# Patient Record
Sex: Male | Born: 1995 | Race: White | Hispanic: No | Marital: Single | Smoking: Current every day smoker
Health system: Southern US, Community
[De-identification: ages and names within clinical notes are randomized; demographics above are authoritative.]

## PROBLEM LIST (undated history)

## (undated) DIAGNOSIS — F32A Depression, unspecified: Secondary | ICD-10-CM

## (undated) DIAGNOSIS — H539 Unspecified visual disturbance: Secondary | ICD-10-CM

## (undated) DIAGNOSIS — F329 Major depressive disorder, single episode, unspecified: Secondary | ICD-10-CM

## (undated) DIAGNOSIS — T7840XA Allergy, unspecified, initial encounter: Secondary | ICD-10-CM

## (undated) DIAGNOSIS — F419 Anxiety disorder, unspecified: Secondary | ICD-10-CM

## (undated) DIAGNOSIS — R51 Headache: Secondary | ICD-10-CM

## (undated) HISTORY — PX: NO PAST SURGERIES: SHX2092

---
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
## 2015-11-14 ENCOUNTER — Ambulatory Visit: Payer: Self-pay | Admitting: Family Medicine

## 2015-11-27 ENCOUNTER — Ambulatory Visit: Payer: Self-pay | Admitting: Family Medicine

## 2015-11-28 ENCOUNTER — Encounter: Payer: Self-pay | Admitting: Nurse Practitioner

## 2016-01-02 ENCOUNTER — Ambulatory Visit: Payer: Self-pay | Admitting: Family Medicine

## 2016-01-06 ENCOUNTER — Encounter: Payer: Self-pay | Admitting: Nurse Practitioner

## 2016-02-25 ENCOUNTER — Ambulatory Visit: Payer: Self-pay | Admitting: Family Medicine

## 2016-03-17 ENCOUNTER — Ambulatory Visit: Payer: Self-pay | Admitting: Family Medicine

## 2016-03-24 ENCOUNTER — Encounter: Payer: Self-pay | Admitting: Family Medicine

## 2016-04-11 ENCOUNTER — Encounter (HOSPITAL_COMMUNITY): Payer: Self-pay | Admitting: Emergency Medicine

## 2016-04-11 ENCOUNTER — Emergency Department (HOSPITAL_COMMUNITY)
Admission: EM | Admit: 2016-04-11 | Discharge: 2016-04-13 | Disposition: A | Discharge status: Other Acute Inpatient Hospital | Payer: BLUE CROSS/BLUE SHIELD | Attending: Emergency Medicine | Admitting: Emergency Medicine

## 2016-04-11 DIAGNOSIS — Z79899 Other long term (current) drug therapy: Secondary | ICD-10-CM | POA: Diagnosis not present

## 2016-04-11 DIAGNOSIS — F333 Major depressive disorder, recurrent, severe with psychotic symptoms: Secondary | ICD-10-CM | POA: Diagnosis not present

## 2016-04-11 DIAGNOSIS — F1410 Cocaine abuse, uncomplicated: Secondary | ICD-10-CM | POA: Diagnosis present

## 2016-04-11 DIAGNOSIS — F17210 Nicotine dependence, cigarettes, uncomplicated: Secondary | ICD-10-CM | POA: Insufficient documentation

## 2016-04-11 DIAGNOSIS — R45851 Suicidal ideations: Secondary | ICD-10-CM | POA: Diagnosis present

## 2016-04-11 LAB — COMPREHENSIVE METABOLIC PANEL
ALBUMIN: 4.5 g/dL (ref 3.5–5.0)
ALT: 20 U/L (ref 17–63)
AST: 25 U/L (ref 15–41)
Alkaline Phosphatase: 72 U/L (ref 38–126)
Anion gap: 8 (ref 5–15)
BILIRUBIN TOTAL: 0.8 mg/dL (ref 0.3–1.2)
BUN: 13 mg/dL (ref 6–20)
CO2: 20 mmol/L — ABNORMAL LOW (ref 22–32)
CREATININE: 1.05 mg/dL (ref 0.61–1.24)
Calcium: 9.2 mg/dL (ref 8.9–10.3)
Chloride: 107 mmol/L (ref 101–111)
GFR calc Af Amer: >60 mL/min (ref >60)
GLUCOSE: 98 mg/dL (ref 65–99)
POTASSIUM: 3.5 mmol/L (ref 3.5–5.1)
Sodium: 135 mmol/L (ref 135–145)
TOTAL PROTEIN: 6.8 g/dL (ref 6.5–8.1)

## 2016-04-11 LAB — CBC
HEMATOCRIT: 40.9 % (ref 39.0–52.0)
Hemoglobin: 14.5 g/dL (ref 13.0–17.0)
MCH: 30.7 pg (ref 26.0–34.0)
MCHC: 35.5 g/dL (ref 30.0–36.0)
MCV: 86.5 fL (ref 78.0–100.0)
PLATELETS: 238 10*3/uL (ref 150–400)
RBC: 4.73 MIL/uL (ref 4.22–5.81)
RDW: 12.7 % (ref 11.5–15.5)
WBC: 3.1 10*3/uL — AB (ref 4.0–10.5)

## 2016-04-11 LAB — ETHANOL

## 2016-04-11 LAB — RAPID URINE DRUG SCREEN, HOSP PERFORMED
Amphetamines: NOT DETECTED
BARBITURATES: NOT DETECTED
BENZODIAZEPINES: NOT DETECTED
Cocaine: POSITIVE — AB
Opiates: NOT DETECTED
Tetrahydrocannabinol: NOT DETECTED

## 2016-04-11 LAB — CBG MONITORING, ED: GLUCOSE-CAPILLARY: 91 mg/dL (ref 65–99)

## 2016-04-11 LAB — ACETAMINOPHEN LEVEL: Acetaminophen (Tylenol), Serum: <10 ug/mL — ABNORMAL LOW (ref 10–30)

## 2016-04-11 LAB — SALICYLATE LEVEL: Salicylate Lvl: <7 mg/dL (ref 2.8–30.0)

## 2016-04-11 MED ORDER — NICOTINE 21 MG/24HR TD PT24
21.0000 mg | MEDICATED_PATCH | Freq: Every day | TRANSDERMAL | Status: DC
  Administered 2016-04-12 – 2016-04-13 (×2): 21 mg via TRANSDERMAL
  Filled 2016-04-11 (×2): qty 1

## 2016-04-11 MED ORDER — ALUM & MAG HYDROXIDE-SIMETH 200-200-20 MG/5ML PO SUSP: 30.0000 mL | ORAL | Status: DC | PRN

## 2016-04-11 MED ORDER — LORAZEPAM 1 MG PO TABS: 1.0000 mg | ORAL_TABLET | Freq: Three times a day (TID) | ORAL | Status: DC | PRN

## 2016-04-11 MED ORDER — IBUPROFEN 200 MG PO TABS: 600.0000 mg | ORAL_TABLET | Freq: Three times a day (TID) | ORAL | Status: DC | PRN

## 2016-04-11 MED ORDER — ZOLPIDEM TARTRATE 5 MG PO TABS
5.0000 mg | ORAL_TABLET | Freq: Every evening | ORAL | Status: DC | PRN
  Administered 2016-04-12 – 2016-04-13 (×2): 5 mg via ORAL
  Filled 2016-04-11 (×2): qty 1

## 2016-04-11 NOTE — ED Notes (Signed)
Patient made aware of urine sample. Urinal given to patient and patient encouraged to void when able.
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
## 2016-04-11 NOTE — ED Notes (Signed)
Poison Control notified. Patient is at risk to cardiac dysrhythmia, seizures, drowsiness, and CNS depression. If patient, seizes, treat patient with benzos, phenobarb is a second choice. Recommends getting repeat EKG after 6 hours. Does not recommend charcoal. Recommend  8 hours observation after intake. Additional recommendations for prolonged QRS or QTC.

## 2016-04-11 NOTE — ED Notes (Signed)
Per Poison Control, patient is to have a repeat EKG at 1900.

## 2016-04-11 NOTE — ED Triage Notes (Signed)
Pt from home via EMS- Per EMS,.pt admits to taking approx 13- 40 mg Celexa approx 2 hrs PTA. Pt reports that he suffers from depression and is upset lately because his girlfriend is expecting. Pt was given 2mg  IV Narcan on scene. Pt reported generalized pain en route and hyperventilated mulitple times per EMS. Pt is A&O and in NAD. Pt admits to SI

## 2016-04-11 NOTE — ED Notes (Signed)
SBAR Report received from previous nurse. Pt received calm and visible on unit. Pt denies current SI/ HI, A/V H, depression, anxiety, or pain at this time, and appears otherwise stable and free of distress. pt endorses that he is tired. Pt reminded of camera surveillance, q 15 min rounds, and rules of the milieu. Will continue to assess.

## 2016-04-11 NOTE — ED Notes (Signed)
TTS at bedside. 

## 2016-04-11 NOTE — ED Provider Notes (Signed)
[facility]-EMERGENCY DEPT Provider Note   CSN: [id number] Arrival date & time: 04/11/16  1259     History   Chief Complaint Chief Complaint  Patient presents with  . Drug Overdose  . Suicidal    HPI Dustin Owens is a 21 y.o. male. He presents for evaluation after a Celexa overdose. Patient is evasive and states "I declined to answer that question" when I ask him about his intentions.  He took 13 Celexa. Paramedics state that his pregnant girlfriend" called out of concern for him. Apparently there was 13 on a counted for pills, I excluding the 5 that apparently the girlfriend took.  HPI  Past Medical History:  Diagnosis Date  . Allergy   . Anxiety   . Depression   . Headache(784.0)   . Vision abnormalities     Patient Active Problem List   Diagnosis Date Noted  . 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 Conduct disorder, childhood onset type 07/01/2011  . Polysubstance dependence (HCC) 07/01/2011    Past Surgical History:  Procedure Laterality Date  . NO PAST SURGERIES         Home Medications    Prior to Admission medications   Medication Sig Start Date End Date Taking? Authorizing Provider  QUEtiapine (SEROQUEL XR) 400 MG 24 hr tablet Take 1 tablet (400 mg total) by mouth daily. 07/07/11 08/06/11  Chauncey MannGlenn E Jennings, MD    Family History Family History  Problem Relation Age of Onset  . OCD Mother   . Drug abuse Mother   . Schizophrenia Father   . Drug abuse Father     Social History Social History  Substance Use Topics  . Smoking status: Current Every Day Smoker    Packs/day: 1.00    Years: 5.00    Types: Cigarettes  . Smokeless tobacco: Never Used  . Alcohol use 0.5 oz/week    1 Standard drinks or equivalent per week     Comment: 1 - 1/2 5ths of bourbon     Allergies   Patient has no known allergies.   Review of Systems Review of Systems  Constitutional: Negative for appetite change, chills, diaphoresis,  fatigue and fever.  HENT: Negative for mouth sores, sore throat and trouble swallowing.   Eyes: Negative for visual disturbance.  Respiratory: Negative for cough, chest tightness, shortness of breath and wheezing.   Cardiovascular: Negative for chest pain.  Gastrointestinal: Negative for abdominal distention, abdominal pain, diarrhea, nausea and vomiting.  Endocrine: Negative for polydipsia, polyphagia and polyuria.  Genitourinary: Negative for dysuria, frequency and hematuria.  Musculoskeletal: Negative for gait problem.  Skin: Negative for color change, pallor and rash.  Neurological: Negative for dizziness, syncope, light-headedness and headaches.  Hematological: Does not bruise/bleed easily.  Psychiatric/Behavioral: Positive for dysphoric mood. Negative for behavioral problems and confusion.     Physical Exam Updated Vital Signs BP 103/81   Pulse 89   Temp 97.6 F (36.4 C) (Oral)   Resp 18   Ht 6' (1.829 m)   Wt 136 lb (61.7 kg)   SpO2 98%   BMI 18.44 kg/m   Physical Exam  Constitutional: He is oriented to person, place, and time. He appears well-developed and well-nourished. No distress.  Awake. Slurred speech. Oriented 3.  HENT:  Head: Normocephalic.  Eyes: Conjunctivae are normal. Pupils are equal, round, and reactive to light. No scleral icterus.  Neck: Normal range of motion. Neck supple. No thyromegaly present.  Cardiovascular: Normal rate and regular  rhythm.  Exam reveals no gallop and no friction rub.   No murmur heard. Pulmonary/Chest: Effort normal and breath sounds normal. No respiratory distress. He has no wheezes. He has no rales.  Abdominal: Soft. Bowel sounds are normal. He exhibits no distension. There is no tenderness. There is no rebound.  Musculoskeletal: Normal range of motion.  Neurological: He is alert and oriented to person, place, and time.  Skin: Skin is warm and dry. No rash noted.  Psychiatric: He has a normal mood and affect. His behavior is  normal.     ED Treatments / Results  Labs (all labs ordered are listed, but only abnormal results are displayed) Labs Reviewed  COMPREHENSIVE METABOLIC PANEL - Abnormal; Notable for the following:       Result Value   CO2 20 (*)    All other components within normal limits  ACETAMINOPHEN LEVEL - Abnormal; Notable for the following:    Acetaminophen (Tylenol), Serum <10 (*)    All other components within normal limits  CBC - Abnormal; Notable for the following:    WBC 3.1 (*)    All other components within normal limits  ETHANOL  SALICYLATE LEVEL  RAPID URINE DRUG SCREEN, HOSP PERFORMED  CBG MONITORING, ED    EKG  EKG Interpretation None       Radiology No results found.  Procedures Procedures (including critical care time)  Medications Ordered in ED Medications - No data to display   Initial Impression / Assessment and Plan / ED Course  I have reviewed the triage vital signs and the nursing notes.  Pertinent labs & imaging results that were available during my care of the patient were reviewed by me and considered in my medical decision making (see chart for details).  Clinical Course     Congestion. Patient will state intent. Screening labs normal. Await toxicology and TTS evaluation.  Final Clinical Impressions(s) / ED Diagnoses   Final diagnoses:  Depression, unspecified depression type    New Prescriptions New Prescriptions   No medications on file     Rolland Porter, MD 04/11/16 1410

## 2016-04-11 NOTE — ED Provider Notes (Signed)
Patient was seen and evaluated by TTS.  They recommended admission to psychiatric facility.  Patient this time is medically cleared for admission.   Dustin Nayobert Bernie Fobes, MD 04/11/16 [phone]

## 2016-04-11 NOTE — ED Notes (Signed)
Bed: WA17 Expected date:  Expected time:  Means of arrival:  Comments: Res A 

## 2016-04-12 DIAGNOSIS — F17210 Nicotine dependence, cigarettes, uncomplicated: Secondary | ICD-10-CM

## 2016-04-12 DIAGNOSIS — Z79899 Other long term (current) drug therapy: Secondary | ICD-10-CM

## 2016-04-12 DIAGNOSIS — Z813 Family history of other psychoactive substance abuse and dependence: Secondary | ICD-10-CM

## 2016-04-12 DIAGNOSIS — F1410 Cocaine abuse, uncomplicated: Secondary | ICD-10-CM | POA: Diagnosis present

## 2016-04-12 DIAGNOSIS — F333 Major depressive disorder, recurrent, severe with psychotic symptoms: Secondary | ICD-10-CM

## 2016-04-12 DIAGNOSIS — R45851 Suicidal ideations: Secondary | ICD-10-CM

## 2016-04-12 DIAGNOSIS — Z818 Family history of other mental and behavioral disorders: Secondary | ICD-10-CM

## 2016-04-12 MED ORDER — IBUPROFEN 200 MG PO TABS
600.0000 mg | ORAL_TABLET | Freq: Three times a day (TID) | ORAL | Status: DC | PRN
  Administered 2016-04-12 (×2): 600 mg via ORAL
  Filled 2016-04-12 (×2): qty 3

## 2016-04-12 MED ORDER — GABAPENTIN 300 MG PO CAPS
300.0000 mg | ORAL_CAPSULE | Freq: Three times a day (TID) | ORAL | Status: DC
  Administered 2016-04-12 – 2016-04-13 (×5): 300 mg via ORAL
  Filled 2016-04-12 (×5): qty 1

## 2016-04-12 MED ORDER — CARBAMAZEPINE 200 MG PO TABS
200.0000 mg | ORAL_TABLET | Freq: Two times a day (BID) | ORAL | Status: DC
  Administered 2016-04-12 – 2016-04-13 (×4): 200 mg via ORAL
  Filled 2016-04-12 (×4): qty 1

## 2016-04-12 MED ORDER — CITALOPRAM HYDROBROMIDE 10 MG PO TABS
10.0000 mg | ORAL_TABLET | Freq: Every day | ORAL | Status: DC
Start: 2016-04-12 — End: 2016-04-12

## 2016-04-12 NOTE — ED Notes (Signed)
SBAR Report received from previous nurse. Pt received calm and visible on unit. Pt denies current SI/ HI, A/V H, depression, anxiety, and rates pain 8/10 in his knee at this time, and appears otherwise stable and free of distress. Pt reminded of camera surveillance, q 15 min rounds, and rules of the milieu. Will continue to assess.

## 2016-04-12 NOTE — ED Notes (Signed)
This nurse with psychiatry team in pt room making morning rounds. Pt presents with sad affect. Reports "stress finally got the best of me." Pt denies HI. Denies AVH. Special checks q 15 mins in place for safety. Video monitoring in place. Will continue to monitor.

## 2016-04-12 NOTE — Progress Notes (Signed)
CSW faxed patient's IVC paperwork to Magistrate office. Magistrate confirmed paperwork received. CSW filed patient's IVC paperwork into IVC log book.  

## 2016-04-12 NOTE — Consult Note (Signed)
[facility] Consult   Reason for Consult:  Intentional overdose Referring Physician:  EDP Patient Identification: Dustin Owens MRN:  [id number] Principal Diagnosis: Severe recurrent major depression with psychotic features [facility]) Diagnosis:   Patient Active Problem List   Diagnosis Date Noted  . Cocaine abuse [F14.10] 04/12/2016    Priority: High  . Severe recurrent major depression with psychotic features ([place]) [F33.3] 07/01/2011    Priority: High  . 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 Polysubstance dependence ([place]) [F19.20] 07/01/2011    Total Time spent with patient: 45 minutes  Subjective:   Dustin Owens is a 21 y.o. male patient admitted with intentional overdose, "I said fuck it".  HPI:  21 yo male who presented to the ED after an overdose on Celexa in a suicide attempt.  Today, he feels a "bit disoriented."  He was stressed yesterday, "stress got the best of me."  His girlfriend is pregnant and they currently have an 48 old daughter.  He felt guilty for relapsing on crack and felt his girlfriend was going to leave him.  Past history of bipolar disorder but stopped taking Seroquel because he could not afford it.  No homicidal ideations or hallucinations  Past Psychiatric History: bipolar disorder, substance abuse   Risk to Self: Suicidal Ideation: Yes-Currently Present Suicidal Intent: Yes-Currently Present Is patient at risk for suicide?: Yes Suicidal Plan?: Yes-Currently Present Specify Current Suicidal Plan: overdose Access to Means: Yes Specify Access to Suicidal Means: girlfriend's medication What has been your use of drugs/alcohol within the last 12 months?: alcohol, cocaine How many times?: 1 Other Self Harm Risks: buring Triggers for Past Attempts: Family contact, Other (Comment) (money stress, relationship stress) Intentional Self Injurious Behavior: Burning (to cover needle marks) Risk to Others: Homicidal  Ideation: No Thoughts of Harm to Others: No Current Homicidal Intent: No Current Homicidal Plan: No Access to Homicidal Means: No Identified Victim: none History of harm to others?: No Assessment of Violence: None Noted Violent Behavior Description: none Does patient have access to weapons?: No Criminal Charges Pending?: Yes (possession in 2014) Describe Pending Criminal Charges: possession of drugs Does patient have a court date: No Prior Inpatient Therapy: Prior Inpatient Therapy: No Prior Outpatient Therapy: Prior Outpatient Therapy: Yes Prior Therapy Dates: as a child Does patient have an ACCT team?: No Does patient have Intensive In-House Services?  : No Does patient have [facility] services? : No Does patient have P4CC services?: No  Past Medical History:  Past Medical History:  Diagnosis Date  . Allergy   . Anxiety   . Depression   . Headache(784.0)   . Vision abnormalities     Past Surgical History:  Procedure Laterality Date  . NO PAST SURGERIES     Family History:  Family History  Problem Relation Age of Onset  . OCD Mother   . Drug abuse Mother   . Schizophrenia Father   . Drug abuse Father    Family Psychiatric  History: none Social History:  History  Alcohol Use  . 0.5 oz/week  . 1 Standard drinks or equivalent per week    Comment: 1 - 1/2 5ths of bourbon     History  Drug Use  . Frequency: 7.0 times per week  . Types: Amphetamines, Benzodiazepines, Cocaine, Codeine, Fentanyl, Hashish, Heroin, Hydrocodone, Hydromorphone, LSD, Marijuana, MDMA (Ecstacy), Methamphetamines, Morphine, Nitrous oxide, Opium, PCP, Solvent inhalants    Social History   Social History  . Marital status: Single  Spouse name: N/A  . Number of children: N/A  . Years of education: N/A   Occupational History  . student     11th grade at a [employer] facility   Social History Main Topics  . Smoking status: Current Every Day Smoker    Packs/day: 1.00    Years: 5.00     Types: Cigarettes  . Smokeless tobacco: Never Used  . Alcohol use 0.5 oz/week    1 Standard drinks or equivalent per week     Comment: 1 - 1/2 5ths of bourbon  . Drug use:     Frequency: 7.0 times per week    Types: Amphetamines, Benzodiazepines, Cocaine, Codeine, Fentanyl, Hashish, Heroin, Hydrocodone, Hydromorphone, LSD, Marijuana, MDMA (Ecstacy), Methamphetamines, Morphine, Nitrous oxide, Opium, PCP, Solvent inhalants  . Sexual activity: Yes    Partners: Female    Birth control/ protection: Condom   Other Topics Concern  . None   Social History Narrative  . None   Additional Social History:    Allergies:  No Known Allergies  Labs:  Results for orders placed or performed during the hospital encounter of 04/11/16 (from the past 48 hour(s))  Comprehensive metabolic panel     Status: Abnormal   Collection Time: 04/11/16  1:05 PM  Result Value Ref Range   Sodium 135 135 - 145 mmol/L   Potassium 3.5 3.5 - 5.1 mmol/L   Chloride 107 101 - 111 mmol/L   CO2 20 (L) 22 - 32 mmol/L   Glucose, Bld 98 65 - 99 mg/dL   BUN 13 6 - 20 mg/dL   Creatinine, Ser 1.05 0.61 - 1.24 mg/dL   Calcium 9.2 8.9 - 10.3 mg/dL   Total Protein 6.8 6.5 - 8.1 g/dL   Albumin 4.5 3.5 - 5.0 g/dL   AST 25 15 - 41 U/L   ALT 20 17 - 63 U/L   Alkaline Phosphatase 72 38 - 126 U/L   Total Bilirubin 0.8 0.3 - 1.2 mg/dL   GFR calc non Af Amer >60 >60 mL/min   GFR calc Af Amer >60 >60 mL/min    Comment: (NOTE) The eGFR has been calculated using the CKD EPI equation. This calculation has not been validated in all clinical situations. eGFR's persistently <60 mL/min signify possible Chronic Kidney Disease.    Anion gap 8 5 - 15  cbc     Status: Abnormal   Collection Time: 04/11/16  1:05 PM  Result Value Ref Range   WBC 3.1 (L) 4.0 - 10.5 K/uL   RBC 4.73 4.22 - 5.81 MIL/uL   Hemoglobin 14.5 13.0 - 17.0 g/dL   HCT 40.9 39.0 - 52.0 %   MCV 86.5 78.0 - 100.0 fL   MCH 30.7 26.0 - 34.0 pg   MCHC 35.5 30.0 - 36.0  g/dL   RDW 12.7 11.5 - 15.5 %   Platelets 238 150 - 400 K/uL  CBG monitoring, ED     Status: None   Collection Time: 04/11/16  1:06 PM  Result Value Ref Range   Glucose-Capillary 91 65 - 99 mg/dL  Ethanol     Status: None   Collection Time: 04/11/16  1:20 PM  Result Value Ref Range   Alcohol, Ethyl (B) <5 <5 mg/dL    Comment:        LOWEST DETECTABLE LIMIT FOR SERUM ALCOHOL IS 5 mg/dL FOR MEDICAL PURPOSES ONLY   Salicylate level     Status: None   Collection Time: 04/11/16  1:20 PM  Result Value Ref Range   Salicylate Lvl <0.6 2.8 - 30.0 mg/dL  Acetaminophen level     Status: Abnormal   Collection Time: 04/11/16  1:20 PM  Result Value Ref Range   Acetaminophen (Tylenol), Serum <10 (L) 10 - 30 ug/mL    Comment:        THERAPEUTIC CONCENTRATIONS VARY SIGNIFICANTLY. A RANGE OF 10-30 ug/mL MAY BE AN EFFECTIVE CONCENTRATION FOR MANY PATIENTS. HOWEVER, SOME ARE BEST TREATED AT CONCENTRATIONS OUTSIDE THIS RANGE. ACETAMINOPHEN CONCENTRATIONS >150 ug/mL AT 4 HOURS AFTER INGESTION AND >50 ug/mL AT 12 HOURS AFTER INGESTION ARE OFTEN ASSOCIATED WITH TOXIC REACTIONS.   Rapid urine drug screen (hospital performed)     Status: Abnormal   Collection Time: 04/11/16  3:04 PM  Result Value Ref Range   Opiates NONE DETECTED NONE DETECTED   Cocaine POSITIVE (A) NONE DETECTED   Benzodiazepines NONE DETECTED NONE DETECTED   Amphetamines NONE DETECTED NONE DETECTED   Tetrahydrocannabinol NONE DETECTED NONE DETECTED   Barbiturates NONE DETECTED NONE DETECTED    Comment:        DRUG SCREEN FOR MEDICAL PURPOSES ONLY.  IF CONFIRMATION IS NEEDED FOR ANY PURPOSE, NOTIFY LAB WITHIN 5 DAYS.        LOWEST DETECTABLE LIMITS FOR URINE DRUG SCREEN Drug Class       Cutoff (ng/mL) Amphetamine      1000 Barbiturate      200 Benzodiazepine   269 Tricyclics       485 Opiates          300 Cocaine          300 THC              50     Current Facility-Administered Medications  Medication Dose  Route Frequency Provider Last Rate Last Dose  . alum & mag hydroxide-simeth (MAALOX/MYLANTA) 200-200-20 MG/5ML suspension 30 mL  30 mL Oral PRN Leonard Schwartz, MD      . carbamazepine (TEGRETOL) tablet 200 mg  200 mg Oral BID Patrecia Pour, NP      . citalopram (CELEXA) tablet 10 mg  10 mg Oral Daily Patrecia Pour, NP      . gabapentin (NEURONTIN) capsule 300 mg  300 mg Oral TID Patrecia Pour, NP      . ibuprofen (ADVIL,MOTRIN) tablet 600 mg  600 mg Oral Q8H PRN Leonard Schwartz, MD      . nicotine (NICODERM CQ - dosed in mg/24 hours) patch 21 mg  21 mg Transdermal Daily Leonard Schwartz, MD   21 mg at 04/12/16 0941  . zolpidem (AMBIEN) tablet 5 mg  5 mg Oral QHS PRN Leonard Schwartz, MD       Current Outpatient Prescriptions  Medication Sig Dispense Refill  . QUEtiapine (SEROQUEL XR) 400 MG 24 hr tablet Take 1 tablet (400 mg total) by mouth daily. 30 tablet 0    Musculoskeletal: Strength & Muscle Tone: within normal limits Gait & Station: normal Patient leans: N/A  Psychiatric Specialty Exam: Physical Exam  Constitutional: He is oriented to person, place, and time. He appears well-developed and well-nourished.  HENT:  Head: Normocephalic.  Neck: Normal range of motion.  Respiratory: Effort normal and breath sounds normal.  Musculoskeletal: Normal range of motion.  Neurological: He is alert and oriented to person, place, and time.  Psychiatric: His speech is normal and behavior is normal. Cognition and memory are normal. He expresses impulsivity. He exhibits a depressed mood. He expresses suicidal ideation. He expresses  suicidal plans.    Review of Systems  Psychiatric/Behavioral: Positive for depression, substance abuse and suicidal ideas. The patient is nervous/anxious.   All other systems reviewed and are negative.   Blood pressure 129/69, pulse 61, temperature 97.8 F (36.6 C), temperature source Oral, resp. rate 16, height 6' (1.829 m), weight 61.7 kg (136 lb), SpO2 100 %.Body mass  index is 18.44 kg/m.  General Appearance: Disheveled  Eye Contact:  Fair  Speech:  Normal Rate  Volume:  Decreased  Mood:  Depressed  Affect:  Congruent  Thought Process:  Coherent and Descriptions of Associations: Intact  Orientation:  Full (Time, Place, and Person)  Thought Content:  Rumination  Suicidal Thoughts:  Yes.  with intent/plan  Homicidal Thoughts:  No  Memory:  Immediate;   Fair Recent;   Fair Remote;   Fair  Judgement:  Poor  Insight:  Fair  Psychomotor Activity:  Decreased  Concentration:  Concentration: Fair and Attention Span: Fair  Recall:  [employer] of Knowledge:  Fair  Language:  Good  Akathisia:  No  Handed:  Right  AIMS (if indicated):     Assets:  Housing Leisure Time Physical Health Resilience Social Support Vocational/Educational  ADL's:  Intact  Cognition:  WNL  Sleep:        Treatment Plan Summary: Daily contact with patient to assess and evaluate symptoms and progress in treatment, Medication management and Plan bipolar affective disorder, depressed, severe without psychosis:  -Crisis stabilization -Medication management:  Start Tegretol 200 mg BID for mood stabilization and gabapentin 300 mg TID for anxiety and withdrawal -Individual and substance abuse counseling  Disposition: Recommend psychiatric Inpatient admission when medically cleared.  Waylan Boga, NP 04/12/2016 12:56 PM   Patient seen for this face to face evaluation, case discussed with treatment team, physician extender and formulated treatment plan. Patient required IVC petition and certification as he has poor insight about his safety concers. Reviewed the information documented and agree with the treatment plan.  Keonna Raether 04/12/2016 1:11 PM

## 2016-04-13 ENCOUNTER — Inpatient Hospital Stay (HOSPITAL_COMMUNITY)
Admission: AD | Admit: 2016-04-13 | Discharge: 2016-04-17 | DRG: 885 | Disposition: A | Discharge status: Home / Self Care | Payer: BLUE CROSS/BLUE SHIELD | Attending: Psychiatry | Admitting: Psychiatry

## 2016-04-13 ENCOUNTER — Encounter (HOSPITAL_COMMUNITY): Payer: Self-pay | Admitting: Emergency Medicine

## 2016-04-13 DIAGNOSIS — Z818 Family history of other mental and behavioral disorders: Secondary | ICD-10-CM | POA: Diagnosis not present

## 2016-04-13 DIAGNOSIS — T1491XA Suicide attempt, initial encounter: Secondary | ICD-10-CM | POA: Diagnosis not present

## 2016-04-13 DIAGNOSIS — G4700 Insomnia, unspecified: Secondary | ICD-10-CM | POA: Diagnosis present

## 2016-04-13 DIAGNOSIS — Z79899 Other long term (current) drug therapy: Secondary | ICD-10-CM | POA: Diagnosis not present

## 2016-04-13 DIAGNOSIS — F411 Generalized anxiety disorder: Secondary | ICD-10-CM | POA: Diagnosis present

## 2016-04-13 DIAGNOSIS — R45851 Suicidal ideations: Secondary | ICD-10-CM | POA: Diagnosis present

## 2016-04-13 DIAGNOSIS — F17210 Nicotine dependence, cigarettes, uncomplicated: Secondary | ICD-10-CM | POA: Diagnosis present

## 2016-04-13 DIAGNOSIS — F333 Major depressive disorder, recurrent, severe with psychotic symptoms: Secondary | ICD-10-CM | POA: Diagnosis not present

## 2016-04-13 DIAGNOSIS — F1410 Cocaine abuse, uncomplicated: Secondary | ICD-10-CM | POA: Diagnosis present

## 2016-04-13 DIAGNOSIS — F314 Bipolar disorder, current episode depressed, severe, without psychotic features: Secondary | ICD-10-CM | POA: Diagnosis present

## 2016-04-13 DIAGNOSIS — Z915 Personal history of self-harm: Secondary | ICD-10-CM

## 2016-04-13 DIAGNOSIS — T43222A Poisoning by selective serotonin reuptake inhibitors, intentional self-harm, initial encounter: Secondary | ICD-10-CM | POA: Diagnosis not present

## 2016-04-13 DIAGNOSIS — Z813 Family history of other psychoactive substance abuse and dependence: Secondary | ICD-10-CM | POA: Diagnosis not present

## 2016-04-13 DIAGNOSIS — T50902A Poisoning by unspecified drugs, medicaments and biological substances, intentional self-harm, initial encounter: Secondary | ICD-10-CM | POA: Diagnosis present

## 2016-04-13 MED ORDER — HYDROXYZINE HCL 25 MG PO TABS
50.0000 mg | ORAL_TABLET | Freq: Four times a day (QID) | ORAL | Status: DC | PRN
  Administered 2016-04-13: 50 mg via ORAL
  Filled 2016-04-13: qty 2

## 2016-04-13 NOTE — ED Notes (Signed)
Patient denies SI, HI and HI . Plan of care discussed. Encouragement and support provided and safety maintain.

## 2016-04-13 NOTE — Progress Notes (Signed)
04/13/16 1400:  LRT went to pt room to offer activities, pt was sleep.  Caroll RancherMarjette Terance Pomplun, LRT/CTRS

## 2016-04-13 NOTE — ED Notes (Signed)
[employer] contacted for transportation to [facility]. 

## 2016-04-13 NOTE — ED Notes (Addendum)
Introduced self to patient. Pt oriented to unit expectations.  Assessed pt for:  A) Anxiety &/or agitation:  Pt has been calm and cooperative this morning, but reports having panic attacks when he is in his room.  Took medications without complaints. Affect flat, mood depressed.   S) Safety: Safety maintained with q-15-minute checks and hourly rounds by staff.  A) ADLs: Pt able to perform ADLs independently.   P) Pick-Up (room cleanliness): Pt's room clean and free of clutter.

## 2016-04-13 NOTE — BH Assessment (Signed)
Faxed clinical information to the following facilities for placement:  [facility]   [address]Neftali Thurow Ellis Patsy BaltimoreWarrick Jr, [facility], [facility], [facility] Triage Specialist [phone]

## 2016-04-13 NOTE — ED Notes (Signed)
[facility]. Belongings given to driver after patient signed for them. Pt left in no acute distress.

## 2016-04-13 NOTE — BH Assessment (Addendum)
[facility] Assessment Progress Note    Psychiatry requested for this patient to be re-evaluated on this day. Writer met with patient face to face. Patient sts that he is not suicidal and would like to discharge home. Patient sts, "Staying in this lock unit is making me feel worse....please let me go". Patient acknowledges that he presented due to an overdose, consuming 13 or more pills. Patient feels remorseful for his decision to overdose. He reports ongoing issues with depression including hopelessness and lack of motivation. Patient reports drinking alcohol and using cocaine "off and on" for years. Writer informed patient of his plan of care. Patient made aware that he is awaiting INPT placement at [facility] or another appropriate facility.

## 2016-04-14 ENCOUNTER — Encounter (HOSPITAL_COMMUNITY): Payer: Self-pay

## 2016-04-14 DIAGNOSIS — F314 Bipolar disorder, current episode depressed, severe, without psychotic features: Secondary | ICD-10-CM | POA: Diagnosis present

## 2016-04-14 DIAGNOSIS — T50902A Poisoning by unspecified drugs, medicaments and biological substances, intentional self-harm, initial encounter: Secondary | ICD-10-CM | POA: Diagnosis present

## 2016-04-14 DIAGNOSIS — Z888 Allergy status to other drugs, medicaments and biological substances status: Secondary | ICD-10-CM

## 2016-04-14 LAB — CBC WITH DIFFERENTIAL/PLATELET
Basophils Absolute: 0 10*3/uL (ref 0.0–0.1)
Basophils Relative: 0 %
Eosinophils Absolute: 0.2 10*3/uL (ref 0.0–0.7)
Eosinophils Relative: 3 %
HEMATOCRIT: 40.7 % (ref 39.0–52.0)
Hemoglobin: 13.9 g/dL (ref 13.0–17.0)
LYMPHS PCT: 36 %
Lymphs Abs: 1.6 10*3/uL (ref 0.7–4.0)
MCH: 30.2 pg (ref 26.0–34.0)
MCHC: 34.2 g/dL (ref 30.0–36.0)
MCV: 88.3 fL (ref 78.0–100.0)
MONO ABS: 0.5 10*3/uL (ref 0.1–1.0)
Monocytes Relative: 10 %
NEUTROS ABS: 2.3 10*3/uL (ref 1.7–7.7)
Neutrophils Relative %: 51 %
Platelets: 224 10*3/uL (ref 150–400)
RBC: 4.61 MIL/uL (ref 4.22–5.81)
RDW: 12.7 % (ref 11.5–15.5)
WBC: 4.6 10*3/uL (ref 4.0–10.5)

## 2016-04-14 MED ORDER — METHOCARBAMOL 500 MG PO TABS
500.0000 mg | ORAL_TABLET | Freq: Three times a day (TID) | ORAL | Status: DC | PRN
  Administered 2016-04-16 – 2016-04-17 (×3): 500 mg via ORAL
  Filled 2016-04-14 (×3): qty 1

## 2016-04-14 MED ORDER — ZOLPIDEM TARTRATE 5 MG PO TABS: 5.0000 mg | ORAL_TABLET | Freq: Every evening | ORAL | Status: DC | PRN

## 2016-04-14 MED ORDER — CARBAMAZEPINE 200 MG PO TABS
200.0000 mg | ORAL_TABLET | Freq: Two times a day (BID) | ORAL | Status: DC
  Administered 2016-04-14 – 2016-04-17 (×7): 200 mg via ORAL
  Filled 2016-04-14 (×3): qty 1
  Filled 2016-04-14: qty 14
  Filled 2016-04-14 (×5): qty 1
  Filled 2016-04-14: qty 14
  Filled 2016-04-14 (×2): qty 1

## 2016-04-14 MED ORDER — TRAZODONE HCL 50 MG PO TABS
50.0000 mg | ORAL_TABLET | Freq: Every evening | ORAL | Status: DC | PRN
  Filled 2016-04-14 (×7): qty 1

## 2016-04-14 MED ORDER — ALUM & MAG HYDROXIDE-SIMETH 200-200-20 MG/5ML PO SUSP: 30.0000 mL | ORAL | Status: DC | PRN

## 2016-04-14 MED ORDER — NICOTINE 21 MG/24HR TD PT24
21.0000 mg | MEDICATED_PATCH | Freq: Every day | TRANSDERMAL | Status: DC
  Administered 2016-04-14 – 2016-04-15 (×2): 21 mg via TRANSDERMAL
  Filled 2016-04-14 (×4): qty 1

## 2016-04-14 MED ORDER — CLONIDINE HCL 0.1 MG PO TABS: 0.1000 mg | ORAL_TABLET | Freq: Every day | ORAL | Status: DC

## 2016-04-14 MED ORDER — MAGNESIUM HYDROXIDE 400 MG/5ML PO SUSP: 30.0000 mL | Freq: Every day | ORAL | Status: DC | PRN

## 2016-04-14 MED ORDER — INFLUENZA VAC SPLIT QUAD 0.5 ML IM SUSY
0.5000 mL | PREFILLED_SYRINGE | INTRAMUSCULAR | Status: AC
  Administered 2016-04-15: 0.5 mL via INTRAMUSCULAR
  Filled 2016-04-14: qty 0.5

## 2016-04-14 MED ORDER — HYDROXYZINE HCL 25 MG PO TABS: 25.0000 mg | ORAL_TABLET | Freq: Four times a day (QID) | ORAL | Status: DC | PRN

## 2016-04-14 MED ORDER — LOPERAMIDE HCL 2 MG PO CAPS: 2.0000 mg | ORAL_CAPSULE | ORAL | Status: DC | PRN

## 2016-04-14 MED ORDER — DICYCLOMINE HCL 20 MG PO TABS: 20.0000 mg | ORAL_TABLET | Freq: Four times a day (QID) | ORAL | Status: DC | PRN

## 2016-04-14 MED ORDER — HYDROXYZINE HCL 50 MG PO TABS
50.0000 mg | ORAL_TABLET | Freq: Four times a day (QID) | ORAL | Status: DC | PRN
  Administered 2016-04-14 – 2016-04-17 (×7): 50 mg via ORAL
  Filled 2016-04-14: qty 1
  Filled 2016-04-14: qty 10
  Filled 2016-04-14 (×6): qty 1

## 2016-04-14 MED ORDER — CLONIDINE HCL 0.1 MG PO TABS: 0.1000 mg | ORAL_TABLET | ORAL | Status: DC

## 2016-04-14 MED ORDER — IBUPROFEN 600 MG PO TABS
600.0000 mg | ORAL_TABLET | Freq: Three times a day (TID) | ORAL | Status: DC | PRN
  Administered 2016-04-14 – 2016-04-16 (×2): 600 mg via ORAL
  Filled 2016-04-14 (×2): qty 1

## 2016-04-14 MED ORDER — ACETAMINOPHEN 325 MG PO TABS: 650.0000 mg | ORAL_TABLET | Freq: Four times a day (QID) | ORAL | Status: DC | PRN

## 2016-04-14 MED ORDER — GABAPENTIN 300 MG PO CAPS
300.0000 mg | ORAL_CAPSULE | Freq: Three times a day (TID) | ORAL | Status: DC
  Administered 2016-04-14: 300 mg via ORAL
  Filled 2016-04-14 (×5): qty 1

## 2016-04-14 MED ORDER — CLONIDINE HCL 0.1 MG PO TABS: 0.1000 mg | ORAL_TABLET | Freq: Four times a day (QID) | ORAL | Status: DC

## 2016-04-14 MED ORDER — ONDANSETRON 4 MG PO TBDP
4.0000 mg | ORAL_TABLET | Freq: Four times a day (QID) | ORAL | Status: DC | PRN
  Administered 2016-04-16: 4 mg via ORAL
  Filled 2016-04-14: qty 1

## 2016-04-14 NOTE — Tx Team (Signed)
Interdisciplinary Treatment and Diagnostic Plan Update  04/14/2016 Time of Session: 9:30AM Dustin Owens MRN: [id number]  Principal Diagnosis: Suicide attempt by drug ingestion [facility])  Secondary Diagnoses: Principal Problem:   Suicide attempt by drug ingestion [facility]) Active Problems:   Bipolar affective disorder, depressed, severe (HCC)   Current Medications:  Current Facility-Administered Medications  Medication Dose Route Frequency Provider Last Rate Last Dose  . acetaminophen (TYLENOL) tablet 650 mg  650 mg Oral Q6H PRN Charm Rings, NP      . alum & mag hydroxide-simeth (MAALOX/MYLANTA) 200-200-20 MG/5ML suspension 30 mL  30 mL Oral Q4H PRN Charm Rings, NP      . carbamazepine (TEGRETOL) tablet 200 mg  200 mg Oral BID Charm Rings, NP   200 mg at 04/14/16 0824  . dicyclomine (BENTYL) tablet 20 mg  20 mg Oral Q6H PRN Adonis Brook, NP      . hydrOXYzine (ATARAX/VISTARIL) tablet 50 mg  50 mg Oral Q6H PRN Adonis Brook, NP   50 mg at 04/14/16 1550  . ibuprofen (ADVIL,MOTRIN) tablet 600 mg  600 mg Oral Q8H PRN Charm Rings, NP   600 mg at 04/14/16 0018  . [START ON 04/15/2016] Influenza vac split quadrivalent PF (FLUARIX) injection 0.5 mL  0.5 mL Intramuscular Tomorrow-1000 Fernando A Cobos, MD      . loperamide (IMODIUM) capsule 2-4 mg  2-4 mg Oral PRN Adonis Brook, NP      . magnesium hydroxide (MILK OF MAGNESIA) suspension 30 mL  30 mL Oral Daily PRN Charm Rings, NP      . methocarbamol (ROBAXIN) tablet 500 mg  500 mg Oral Q8H PRN Adonis Brook, NP      . nicotine (NICODERM CQ - dosed in mg/24 hours) patch 21 mg  21 mg Transdermal Daily Charm Rings, NP   21 mg at 04/14/16 0825  . ondansetron (ZOFRAN-ODT) disintegrating tablet 4 mg  4 mg Oral Q6H PRN Adonis Brook, NP      . traZODone (DESYREL) tablet 50 mg  50 mg Oral QHS,MR X 1 Kerry Hough, PA-C       PTA Medications: Prescriptions Prior to Admission  Medication Sig Dispense Refill Last Dose  . QUEtiapine  (SEROQUEL XR) 400 MG 24 hr tablet Take 1 tablet (400 mg total) by mouth daily. 30 tablet 0     Patient Stressors: Paediatric nurse issue Marital or family conflict Occupational concerns Substance abuse  Patient Strengths: Dentist for treatment/growth  Treatment Modalities: Medication Management, Group therapy, Case management,  1 to 1 session with clinician, Psychoeducation, Recreational therapy.   Physician Treatment Plan for Primary Diagnosis: Suicide attempt by drug ingestion (HCC) Long Term Goal(s): Improvement in symptoms so as ready for discharge Improvement in symptoms so as ready for discharge   Short Term Goals: Ability to identify changes in lifestyle to reduce recurrence of condition will improve Ability to verbalize feelings will improve Ability to disclose and discuss suicidal ideas Ability to demonstrate self-control will improve Ability to identify and develop effective coping behaviors will improve Ability to maintain clinical measurements within normal limits will improve Compliance with prescribed medications will improve Ability to identify triggers associated with substance abuse/mental health issues will improve Ability to identify changes in lifestyle to reduce recurrence of condition will improve Ability to verbalize feelings will improve Ability to disclose and discuss suicidal ideas Ability to demonstrate self-control will improve Ability to identify and develop effective coping  behaviors will improve Ability to maintain clinical measurements within normal limits will improve Compliance with prescribed medications will improve Ability to identify triggers associated with substance abuse/mental health issues will improve  Medication Management: Evaluate patient's response, side effects, and tolerance of medication regimen.  Therapeutic Interventions: 1 to 1 sessions, Unit Group sessions and  Medication administration.  Evaluation of Outcomes: Progressing  Physician Treatment Plan for Secondary Diagnosis: Principal Problem:   Suicide attempt by drug ingestion (HCC) Active Problems:   Bipolar affective disorder, depressed, severe (HCC)  Long Term Goal(s): Improvement in symptoms so as ready for discharge Improvement in symptoms so as ready for discharge   Short Term Goals: Ability to identify changes in lifestyle to reduce recurrence of condition will improve Ability to verbalize feelings will improve Ability to disclose and discuss suicidal ideas Ability to demonstrate self-control will improve Ability to identify and develop effective coping behaviors will improve Ability to maintain clinical measurements within normal limits will improve Compliance with prescribed medications will improve Ability to identify triggers associated with substance abuse/mental health issues will improve Ability to identify changes in lifestyle to reduce recurrence of condition will improve Ability to verbalize feelings will improve Ability to disclose and discuss suicidal ideas Ability to demonstrate self-control will improve Ability to identify and develop effective coping behaviors will improve Ability to maintain clinical measurements within normal limits will improve Compliance with prescribed medications will improve Ability to identify triggers associated with substance abuse/mental health issues will improve     Medication Management: Evaluate patient's response, side effects, and tolerance of medication regimen.  Therapeutic Interventions: 1 to 1 sessions, Unit Group sessions and Medication administration.  Evaluation of Outcomes: Progressing   RN Treatment Plan for Primary Diagnosis: Suicide attempt by drug ingestion (HCC) Long Term Goal(s): Knowledge of disease and therapeutic regimen to maintain health will improve  Short Term Goals: Ability to remain free from injury will  improve, Ability to participate in decision making will improve and Ability to verbalize feelings will improve  Medication Management: RN will administer medications as ordered by provider, will assess and evaluate patient's response and provide education to patient for prescribed medication. RN will report any adverse and/or side effects to prescribing provider.  Therapeutic Interventions: 1 on 1 counseling sessions, Psychoeducation, Medication administration, Evaluate responses to treatment, Monitor vital signs and CBGs as ordered, Perform/monitor CIWA, COWS, AIMS and Fall Risk screenings as ordered, Perform wound care treatments as ordered.  Evaluation of Outcomes: Progressing   LCSW Treatment Plan for Primary Diagnosis: Suicide attempt by drug ingestion [facility](HCC) Long Term Goal(s): Safe transition to appropriate next level of care at discharge, Engage patient in therapeutic group addressing interpersonal concerns.  Short Term Goals: Engage patient in aftercare planning with referrals and resources, Facilitate patient progression through stages of change regarding substance use diagnoses and concerns and Identify triggers associated with mental health/substance abuse issues  Therapeutic Interventions: Assess for all discharge needs, 1 to 1 time with Social worker, Explore available resources and support systems, Assess for adequacy in community support network, Educate family and significant other(s) on suicide prevention, Complete Psychosocial Assessment, Interpersonal group therapy.  Evaluation of Outcomes: Progressing   Progress in Treatment: Attending groups: No. New to unit. Continuing to assess.  Participating in groups: No. Taking medication as prescribed: Yes. Toleration medication: Yes. Family/Significant other contact made: No, will contact:  family member if patient consents. Patient understands diagnosis: Yes. Discussing patient identified problems/goals with staff: Yes. Medical  problems stabilized or resolved: Yes. Denies  suicidal/homicidal ideation: No. Passive SI/Able to contract for safety on the unit.  Issues/concerns per patient self-inventory: No. Other: n/a  New problem(s) identified: No, Describe:  n/a  New Short Term/Long Term Goal(s):  Discharge Plan or Barriers:   Reason for Continuation of Hospitalization: Depression Medication stabilization Suicidal ideation Withdrawal symptoms  Estimated Length of Stay: 3-5 days   Attendees: Patient: 04/14/2016 4:35 PM  Physician: Dr. Jama Flavors MD 04/14/2016 4:35 PM  Nursing: Rayfield Citizen RN; [place]ette RN 04/14/2016 4:35 PM  RN Care Manager: Onnie Boer CM 04/14/2016 4:35 PM  Social Worker: Herbert Seta Smart, LCSW; Vernie Shanks LCSW 04/14/2016 4:35 PM  Recreational Therapist:  04/14/2016 4:35 PM  Other: Hillery Jacks; May Augustin NP 04/14/2016 4:35 PM  Other:  04/14/2016 4:35 PM  Other: 04/14/2016 4:35 PM    Scribe for Treatment Team: Ledell Peoples Smart, LCSW 04/14/2016 4:35 PM

## 2016-04-14 NOTE — Progress Notes (Signed)
D: Patient states he is not here "for substance abuse."  He couldn't understand why his Remus Lofflerambien was discontinued.  Patient states he cannot sleep with trazodone.  He rates his depression as a 3; hopelessness as a 4; anxiety as a 7.  He denies any thoughts of self harm.  Patient has flat, blunted affect.  His main complaint is anxiety.  Patient states, "I just did something stupid. Patient has minimal withdrawal symptoms. A: Continue to monitor medication management and MD orders.  Safety checks completed every 15 minutes per protocol.  Offer support and encouragement as needed. R: Patient is receptive to staff; his behavior is appropriate.

## 2016-04-14 NOTE — Progress Notes (Signed)
Psychoeducational Group Note  Date:  04/14/2016 Time:  2323  Group Topic/Focus:  Wrap-Up Group:   The focus of this group is to help patients review their daily goal of treatment and discuss progress on daily workbooks.   Participation Level: Did Not Attend  Participation Quality:  Not Applicable  Affect:  Not Applicable  Cognitive:  Not Applicable  Insight:  Not Applicable  Engagement in Group: Not Applicable  Additional Comments:  The patient did not attend group this evening since he was asleep in his bed.    Hazle CocaGOODMAN, Sameul Tagle S 04/14/2016, 11:23 PM

## 2016-04-14 NOTE — [facility] Group Notes (Signed)
[facility] LCSW Group Therapy  04/14/2016 4:14 PM  Type of Therapy:  Group Therapy  Participation Level:  Active  Participation Quality:  Attentive  Affect:  Appropriate  Cognitive:  Alert and Oriented  Insight:  Improving  Engagement in Therapy:  Improving  Modes of Intervention:  Discussion, Education, Exploration, Socialization and Support  Summary of Progress/Problems: [facility] Speaker came to talk about his personal journey with substance abuse and addiction. The pt processed ways by which to relate to the speaker. MHA speaker provided handouts and educational information pertaining to groups and services [facility] by [facility].   Alya Smaltz N Smart LCSW 04/14/2016, 4:14 PM

## 2016-04-14 NOTE — H&P (Signed)
Psychiatric Admission Assessment Adult  Patient Identification: ERMIN PARISIEN MRN:  [id number] Date of Evaluation:  04/14/2016 Chief Complaint:  MDD SEV REC WITH PSYCHOTIC FEATURES Principal Diagnosis: Bipolar affective disorder, depressed, severe ([place]) Diagnosis:   Patient Active Problem List   Diagnosis Date Noted  . Bipolar affective disorder, depressed, severe ([place]) [F31.4] 04/14/2016    Priority: High  . Cocaine abuse [F14.10] 04/12/2016  . 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 Polysubstance dependence [facility]) [F19.20] 07/01/2011   History of Present Illness: Kaitlin A Gehret is an 21 y.o. male was brought to [facility] by EMS after ingesting 13 - 40 mg Celexa per nurse note.  He states that he does not want be here.  He has poor insight into the events of his Celexa overdose.  He is irritable and wants to be on Ativan.   Patient reports that he took his wife's  medication because he is suicidal.  He states that when he goes home all he hears is that he is good for nothing.  Then he also states that he wants to go home and not be here because he is so anxious and that standing is painful for him.  Patient reports current stressors include money, relational conflict and issues at his job.  "I wish I could do my life over, now I have a baby and one on the way and a wife that nags me all the time."    Patient reports overdosing in an attempt at suicide when he was a teen.  Patient was inpatient at [facility] in 2013 on the adolescent unit.  Patient had been on Seroquel in 2014 and reports he was compliant for about 7 mos but stopped due to increased somnolence.  Per chart report, patient was  using alcohol weekly and last drank a 40 oz on 04/10/16, weekly cocaine use and used $40 worth 2 days ago.    He states that Tegretol which was started at [facility] seems to be doing ok.    Associated  Signs/Symptoms: Depression Symptoms:  depressed mood, hopelessness, anxiety, (Hypo) Manic Symptoms:  Irritable Mood, Anxiety Symptoms:  Excessive Worry, Psychotic Symptoms:  NA PTSD Symptoms: NA Total Time spent with patient: 45 minutes  Past Psychiatric History: see HPI  Is the patient at risk to self? Yes.    Has the patient been a risk to self in the past 6 months? Yes.    Has the patient been a risk to self within the distant past? Yes.    Is the patient a risk to others? No.  Has the patient been a risk to others in the past 6 months? No.  Has the patient been a risk to others within the distant past? No.   Prior Inpatient Therapy:   Prior Outpatient Therapy:    Alcohol Screening: 1. How often do you have a drink containing alcohol?: 4 or more times a week 2. How many drinks containing alcohol do you have on a typical day when you are drinking?: 3 or 4 3. How often do you have six or more drinks on one occasion?: Monthly Preliminary Score: 3 4. How often during the last year have you found that you were not able to stop drinking once you had started?: Never 5. How often during the last year have you failed to do what was normally expected from you becasue of drinking?: Never 6. How often during the  last year have you needed a first drink in the morning to get yourself going after a heavy drinking session?: Never 7. How often during the last year have you had a feeling of guilt of remorse after drinking?: Weekly 8. How often during the last year have you been unable to remember what happened the night before because you had been drinking?: Less than monthly 9. Have you or someone else been injured as a result of your drinking?: No 10. Has a relative or friend or a doctor or another health worker been concerned about your drinking or suggested you cut down?: Yes, during the last year Alcohol Use Disorder Identification Test Final Score (AUDIT): 15 Brief Intervention:  Yes Substance Abuse History in the last 12 months:  Yes.   Consequences of Substance Abuse: inpatient treatment Previous Psychotropic Medications: Yes  Psychological Evaluations: Yes  Past Medical History:  Past Medical History:  Diagnosis Date  . Allergy   . Anxiety   . Depression   . Headache(784.0)   . Vision abnormalities     Past Surgical History:  Procedure Laterality Date  . NO PAST SURGERIES     Family History:  Family History  Problem Relation Age of Onset  . OCD Mother   . Drug abuse Mother   . Schizophrenia Father   . Drug abuse Father    Family Psychiatric  History: see HPI, Pt reports a family history of suicide and substance abuse.  Tobacco Screening: Have you used any form of tobacco in the last 30 days? (Cigarettes, Smokeless Tobacco, Cigars, and/or Pipes): Yes Tobacco use, Select all that apply: 5 or more cigarettes per day Are you interested in Tobacco Cessation Medications?: Yes, will notify MD for an order Counseled patient on smoking cessation including recognizing danger situations, developing coping skills and basic information about quitting provided: Refused/Declined practical counseling Social History:  History  Alcohol Use  . 0.5 oz/week  . 1 Standard drinks or equivalent per week    Comment: 1 - 1/2 5ths of bourbon     History  Drug Use  . Frequency: 7.0 times per week  . Types: Amphetamines, Benzodiazepines, Cocaine, Codeine, Fentanyl, Hashish, Heroin, Hydrocodone, Hydromorphone, LSD, Marijuana, MDMA (Ecstacy), Methamphetamines, Morphine, Nitrous oxide, Opium, PCP, Solvent inhalants    Additional Social History:      Pain Medications: pt denies Prescriptions: pt denies Over the Counter: pt denies History of alcohol / drug use?: Yes Longest period of sobriety (when/how long): 2 weeks Negative Consequences of Use: Legal Withdrawal Symptoms: Agitation, Aggressive/Assaultive, Fever / Chills, Irritability, Sweats, Tingling Name of  Substance 1: alcohol 1 - Age of First Use: 10 1 - Amount (size/oz): 6 beers to half a gallon 1 - Frequency: weekly 1 - Duration: since age 68 1 - Last Use / Amount: 04/10/16, 40 oz Name of Substance 2: Cocaine 2 - Age of First Use: 14 2 - Amount (size/oz): 2 grams 2 - Frequency: weekly 2 - Duration: unknown 2 - Last Use / Amount: 2 days ago, $40    Allergies:   Allergies  Allergen Reactions  . Naproxen Nausea And Vomiting   Lab Results: No results found for this or any previous visit (from the past 48 hour(s)).  Blood Alcohol level:  Lab Results  Component Value Date   ETH <5 04/11/2016   ETH <11 19/14/7829    Metabolic Disorder Labs:  No results found for: HGBA1C, MPG No results found for: PROLACTIN No results found for: CHOL, TRIG, HDL, CHOLHDL,  VLDL, LDLCALC  Current Medications: Current Facility-Administered Medications  Medication Dose Route Frequency Provider Last Rate Last Dose  . acetaminophen (TYLENOL) tablet 650 mg  650 mg Oral Q6H PRN Patrecia Pour, NP      . alum & mag hydroxide-simeth (MAALOX/MYLANTA) 200-200-20 MG/5ML suspension 30 mL  30 mL Oral Q4H PRN Patrecia Pour, NP      . carbamazepine (TEGRETOL) tablet 200 mg  200 mg Oral BID Patrecia Pour, NP   200 mg at 04/14/16 0824  . gabapentin (NEURONTIN) capsule 300 mg  300 mg Oral TID Patrecia Pour, NP   300 mg at 04/14/16 0824  . ibuprofen (ADVIL,MOTRIN) tablet 600 mg  600 mg Oral Q8H PRN Patrecia Pour, NP   600 mg at 04/14/16 0018  . [START ON 04/15/2016] Influenza vac split quadrivalent PF (FLUARIX) injection 0.5 mL  0.5 mL Intramuscular Tomorrow-1000 Fernando A Cobos, MD      . magnesium hydroxide (MILK OF MAGNESIA) suspension 30 mL  30 mL Oral Daily PRN Patrecia Pour, NP      . nicotine (NICODERM CQ - dosed in mg/24 hours) patch 21 mg  21 mg Transdermal Daily Patrecia Pour, NP   21 mg at 04/14/16 0825  . traZODone (DESYREL) tablet 50 mg  50 mg Oral QHS,MR X 1 Spencer E Simon, PA-C       PTA  Medications: Prescriptions Prior to Admission  Medication Sig Dispense Refill Last Dose  . QUEtiapine (SEROQUEL XR) 400 MG 24 hr tablet Take 1 tablet (400 mg total) by mouth daily. 30 tablet 0     Musculoskeletal: Strength & Muscle Tone: within normal limits Gait & Station: normal Patient leans: N/A  Psychiatric Specialty Exam: Physical Exam  Nursing note and vitals reviewed.   ROS  Blood pressure 124/83, pulse 89, temperature 97.5 F (36.4 C), temperature source Oral, resp. rate 16, height 6' (1.829 m), weight 63 kg (139 lb).Body mass index is 18.85 kg/m.  General Appearance: Disheveled  Eye Contact:  Fair  Speech:  Normal Rate  Volume:  Decreased  Mood:  Depressed  Affect:  Congruent  Thought Process:  Coherent and Descriptions of Associations: Intact  Orientation:  Full (Time, Place, and Person)  Thought Content:  Rumination  Suicidal Thoughts:  Yes.  with intent/plan  Homicidal Thoughts:  No  Memory:  Immediate;   Fair Recent;   Fair Remote;   Fair  Judgement:  Poor  Insight:  Shallow  Psychomotor Activity:  Decreased  Concentration:  Concentration: Fair and Attention Span: Fair  Recall:  [employer] of Knowledge:  Fair  Language:  Good  Akathisia:  No  Handed:  Right  AIMS (if indicated):     Assets:  Housing Physical Health Resilience  ADL's:  Intact  Cognition:  WNL  Sleep:  Number of Hours: 4.75   Treatment Plan Summary: Admit for crisis management and mood stabilization. Medication management to re-stabilize current mood symptoms Group counseling sessions for coping skills Medical consults as needed Review and reinstate any pertinent home medications for other health problems  Observation Level/Precautions:  15 minute checks  Laboratory:  Per ED, UDS cocaine pos  Psychotherapy:  group  Medications:  increased Vistaril to 50 mg PRN  Consultations:  As needed  Discharge Concerns:  safety  Estimated LOS:  2-7 days  Other:     Physician Treatment  Plan for Primary Diagnosis: Bipolar affective disorder, depressed, severe ([place]) Long Term Goal(s): Improvement in symptoms so  as ready for discharge  Short Term Goals: Ability to identify changes in lifestyle to reduce recurrence of condition will improve, Ability to verbalize feelings will improve, Ability to disclose and discuss suicidal ideas, Ability to demonstrate self-control will improve, Ability to identify and develop effective coping behaviors will improve, Ability to maintain clinical measurements within normal limits will improve, Compliance with prescribed medications will improve and Ability to identify triggers associated with substance abuse/mental health issues will improve  Physician Treatment Plan for Secondary Diagnosis: Principal Problem:   Bipolar affective disorder, depressed, severe ([place])  Long Term Goal(s): Improvement in symptoms so as ready for discharge  Short Term Goals: Ability to identify changes in lifestyle to reduce recurrence of condition will improve, Ability to verbalize feelings will improve, Ability to disclose and discuss suicidal ideas, Ability to demonstrate self-control will improve, Ability to identify and develop effective coping behaviors will improve, Ability to maintain clinical measurements within normal limits will improve, Compliance with prescribed medications will improve and Ability to identify triggers associated with substance abuse/mental health issues will improve  I certify that inpatient services furnished can reasonably be expected to improve the patient's condition.    [facility], NP [facility] 1/16/20189:22 AM   I have discussed case with NP and have met with patient  Agree with NP note and assessment  21 year old male, lives with GF, has one daughter - 8 months- employed. Reports he has frequent , short lived mood swings, up to several a day. He overdosed on GF's prescribed Celexa- took 14 tablets. This occurred 3 days ago. States  his intent was " to die", but states he regretted suicide attempt shortly after it occurred, thinking of his daughter. He has a history of one prior hospitalization at age 50 for suicide attempt, depression . At the time was prescribed Seroquel- has not taken any psychiatric medications " for years ".  He drinks in binges , usually once ever two weeks.  He uses cocaine 3 x a week.  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 Medical history is remarkable for nephrolithiasis.  Dx- Suicide Attempt  Plan- Patient has been started on Tegretol.
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
## 2016-04-14 NOTE — Tx Team (Signed)
Initial Treatment Plan 04/14/2016 12:28 AM Dustin Owens [id number]    PATIENT STRESSORS: Financial difficulties Legal issue Marital or family conflict Occupational concerns Substance abuse   PATIENT STRENGTHS: Wellsite geologistCommunication skills General fund of knowledge Motivation for treatment/growth   PATIENT IDENTIFIED PROBLEMS: Depression  Anxiety  Suicidal ideation  Substance abuse  "Be able to no snap like that again"  "Anxiety better"           DISCHARGE CRITERIA:  Improved stabilization in mood, thinking, and/or behavior Verbal commitment to aftercare and medication compliance Withdrawal symptoms are absent or subacute and managed without 24-hour nursing intervention  PRELIMINARY DISCHARGE PLAN: Outpatient therapy medication management  PATIENT/FAMILY INVOLVEMENT: This treatment plan has been presented to and reviewed with the patient, Dustin Owens.  The patient and family have been given the opportunity to ask questions and make suggestions.  Levin BaconHeather V Wilburta Milbourn, RN 04/14/2016, 12:28 AM
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
## 2016-04-14 NOTE — Progress Notes (Addendum)
Report received from admitting RN.  Pt denies SI/HI, denies hallucinations, denies withdrawal symptoms, reports right knee pain of 5/10.  PRN medication administered for pain.  Pt verbally contracts for safety and reports he will inform staff of needs and concerns.  Will continue to monitor and assess.

## 2016-04-14 NOTE — Progress Notes (Signed)
Recreation Therapy Notes  Animal-Assisted Activity (AAA) Program Checklist/Progress Notes Patient Eligibility Criteria Checklist & Daily Group note for Rec TxIntervention  Date: 01.16.2018 Time: 2:45pm Location: [address] Dayroom    AAA/T Program Assumption of Risk Form signed by Patient/ or Parent Legal Guardian Yes  Patient is free of allergies or sever asthma Yes  Patient reports no fear of animals Yes  Patient reports no history of cruelty to animals Yes  Patient understands his/her participation is voluntary Yes  Behavioral Response: Did not attend.   Brexton Sofia L Daianna Vasques, LRT/CTRS        Marce Charlesworth L 04/14/2016 3:06 PM 

## 2016-04-14 NOTE — Progress Notes (Signed)
Dustin Owens is a 21 year old male being admitted involuntarily to [facility] from [facility].  He came to the ED via ambulance after ingesting 13-40 mg Celexa pills (his girlfriends).  He reported one prior suicide attempt in the past as a teen and [facility]  He reported multiple stressor including money problems, relationship problems and issues with his job.  His girlfriend his currently pregnant and they also have an 318 month old daughter.  He is reporting feeling anxious, hopeless, isolating and trouble sleeping.  He denies any medical issues and appears to be in no physical distress.  He currently denies suicidal ideation and will contract for safety on the unit.  He denies HI or A/V hallucinations.  Oriented him to the unit.  Admission paperwork completed and signed.  No belongings on admission but stated he had some in the police car.  Security was notified and they will be looking for his belongings.  Skin assessment completed and noted multiple tattoos and no other skin issues noted.  Q 15 minute checks initiated for safety.  We will monitor the progress towards his goals.

## 2016-04-15 DIAGNOSIS — T1491XA Suicide attempt, initial encounter: Secondary | ICD-10-CM

## 2016-04-15 DIAGNOSIS — Z79899 Other long term (current) drug therapy: Secondary | ICD-10-CM

## 2016-04-15 DIAGNOSIS — T43222A Poisoning by selective serotonin reuptake inhibitors, intentional self-harm, initial encounter: Secondary | ICD-10-CM

## 2016-04-15 DIAGNOSIS — Z818 Family history of other mental and behavioral disorders: Secondary | ICD-10-CM

## 2016-04-15 DIAGNOSIS — F17210 Nicotine dependence, cigarettes, uncomplicated: Secondary | ICD-10-CM

## 2016-04-15 DIAGNOSIS — F314 Bipolar disorder, current episode depressed, severe, without psychotic features: Principal | ICD-10-CM

## 2016-04-15 DIAGNOSIS — Z813 Family history of other psychoactive substance abuse and dependence: Secondary | ICD-10-CM

## 2016-04-15 LAB — TSH: TSH: 2.132 u[IU]/mL (ref 0.350–4.500)

## 2016-04-15 MED ORDER — ZOLPIDEM TARTRATE 5 MG PO TABS
5.0000 mg | ORAL_TABLET | Freq: Every evening | ORAL | Status: DC | PRN
  Administered 2016-04-15 – 2016-04-16 (×2): 5 mg via ORAL
  Filled 2016-04-15 (×2): qty 1

## 2016-04-15 MED ORDER — NICOTINE POLACRILEX 2 MG MT GUM
2.0000 mg | CHEWING_GUM | OROMUCOSAL | Status: DC | PRN
  Administered 2016-04-16 – 2016-04-17 (×2): 2 mg via ORAL
  Filled 2016-04-15 (×2): qty 1

## 2016-04-15 NOTE — [facility] Group Notes (Signed)
[facility] Note   04/15/2016 11:21 AM  Participation Quality:  Appropriate   Mood/Affect:  Anxious  Depression Rating:  4  Anxiety Rating:  8  Thoughts of Suicide:  No Will you contract for safety?   NA  Current AVH:  No  Plan for Discharge/Comments:  Pt wants to discharge "as soon as the weather clears." Pt reports high anxiety due to "home issues." He would like to return to work and [facility]states he has [facility]. CSW continuing to assess.  Transportation Means: unknown/girlfriend likely  Supports: girlfriend is "mildly supportive."  Cabin crewHeather N Smart LCSW

## 2016-04-15 NOTE — Progress Notes (Signed)
D: Pt presents with depressed affect and mood.  He was in bed in his room upon initial approach.  When asked how his day was, pt states "I'm alive."  Pt reports he "might be going home Thursday" and he feels safe to do so.  Denies SI/HI, denies hallucinations, reports right knee pain of 5/10.  Pt has stayed in his room for the majority of the night; did not attend evening group.    A: Introduced self to pt.  Support and encouragement offered.  PRN medication for pain offered, pt declined.    R: Scheduled Trazodone was not administered because pt was asleep.  He verbally contracts for safety.  Will continue to monitor and assess.

## 2016-04-15 NOTE — Progress Notes (Signed)
[facility] MD Progress Note  04/15/2016 4:40 PM Dustin Owens  MRN:  [id number]  Subjective: "I am not sleeping well. Trazodone is giving me a metallic taste in my mouth. I'm having a horrible nightmares. Why can't I have Ambien that has been working for me. With Ambien I usually will have a restful sleep. I have bad anxiety issues. What am I suppose to do? This is why I'm suppose to get good treatment here to be able to function out there after discharge".  Objective: Patient is seen, chart reviewed. He is alert, oriented x 4. He says he is not sleep ing well. Does not care for Trazodone as he says it is giving him some metallic taste in his mouth & causing him to have bad nightmares. He prefers Ambien, if nothing else while he in the hospital now. He says he needed a restful sleep. He declines Remeron & does not want to try Neurontin because he had done so in the past & felt they were not effective. Dustin Owens is attending group sessions & participating in some of the sessions. He denies major symptoms of depression, however, admits feeling frustrated. He denies any SIHI, AVH, delusional thoughts or paranoia. He is not disruptive on the unit. He currently is tolerating tegretol.   Principal Problem: Suicide attempt by drug ingestion [facility])  Diagnosis:   Patient Active Problem List   Diagnosis Date Noted  . Bipolar affective disorder, depressed, severe (HCC) [F31.4] 04/14/2016  . Suicide attempt by drug ingestion (HCC) [T50.902A]   . Cocaine abuse [F14.10] 04/12/2016  . Severe recurrent major depression with psychotic features (HCC) [F33.3] 07/01/2011  . 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 Polysubstance dependence (HCC) [F19.20] 07/01/2011   Total Time spent with patient: 25 minutes  Past Psychiatric History: Polysubstance dependence  Past Medical History:  Past Medical History:  Diagnosis Date  . Allergy   . Anxiety   . Depression   . Headache(784.0)   . Vision  abnormalities     Past Surgical History:  Procedure Laterality Date  . NO PAST SURGERIES     Family History:  Family History  Problem Relation Age of Onset  . OCD Mother   . Drug abuse Mother   . Schizophrenia Father   . Drug abuse Father    Family Psychiatric  History: See H&P  Social History:  History  Alcohol Use  . 0.5 oz/week  . 1 Standard drinks or equivalent per week    Comment: 1 - 1/2 5ths of bourbon     History  Drug Use  . Frequency: 7.0 times per week  . Types: Amphetamines, Benzodiazepines, Cocaine, Codeine, Fentanyl, Hashish, Heroin, Hydrocodone, Hydromorphone, LSD, Marijuana, MDMA (Ecstacy), Methamphetamines, Morphine, Nitrous oxide, Opium, PCP, Solvent inhalants    Social History   Social History  . Marital status: Single    Spouse name: N/A  . Number of children: N/A  . Years of education: N/A   Occupational History  . student     11th grade at a [employer] facility   Social History Main Topics  . Smoking status: Current Every Day Smoker    Packs/day: 1.00    Years: 5.00    Types: Cigarettes  . Smokeless tobacco: Never Used  . Alcohol use 0.5 oz/week    1 Standard drinks or equivalent per week     Comment: 1 - 1/2 5ths of bourbon  . Drug use:     Frequency: 7.0 times per week  Types: Amphetamines, Benzodiazepines, Cocaine, Codeine, Fentanyl, Hashish, Heroin, Hydrocodone, Hydromorphone, LSD, Marijuana, MDMA (Ecstacy), Methamphetamines, Morphine, Nitrous oxide, Opium, PCP, Solvent inhalants  . Sexual activity: Yes    Partners: Female    Birth control/ protection: Condom   Other Topics Concern  . None   Social History Narrative  . None   Additional Social History:    Pain Medications: pt denies Prescriptions: pt denies Over the Counter: pt denies History of alcohol / drug use?: Yes Longest period of sobriety (when/how long): 2 weeks Negative Consequences of Use: Legal Withdrawal Symptoms: Agitation, Aggressive/Assaultive, Fever /  Chills, Irritability, Sweats, Tingling Name of Substance 1: alcohol 1 - Age of First Use: 10 1 - Amount (size/oz): 6 beers to half a gallon 1 - Frequency: weekly 1 - Duration: since age 42 1 - Last Use / Amount: 04/10/16, 40 oz Name of Substance 2: Cocaine 2 - Age of First Use: 14 2 - Amount (size/oz): 2 grams 2 - Frequency: weekly 2 - Duration: unknown 2 - Last Use / Amount: 2 days ago, $40  Sleep: "I'm not sleeping well"  Appetite:  Fair  Current Medications: Current Facility-Administered Medications  Medication Dose Route Frequency Provider Last Rate Last Dose  . acetaminophen (TYLENOL) tablet 650 mg  650 mg Oral Q6H PRN Charm Rings, NP      . alum & mag hydroxide-simeth (MAALOX/MYLANTA) 200-200-20 MG/5ML suspension 30 mL  30 mL Oral Q4H PRN Charm Rings, NP      . carbamazepine (TEGRETOL) tablet 200 mg  200 mg Oral BID Charm Rings, NP   200 mg at 04/15/16 0757  . dicyclomine (BENTYL) tablet 20 mg  20 mg Oral Q6H PRN Adonis Brook, NP      . hydrOXYzine (ATARAX/VISTARIL) tablet 50 mg  50 mg Oral Q6H PRN Adonis Brook, NP   50 mg at 04/15/16 1546  . ibuprofen (ADVIL,MOTRIN) tablet 600 mg  600 mg Oral Q8H PRN Charm Rings, NP   600 mg at 04/14/16 0018  . loperamide (IMODIUM) capsule 2-4 mg  2-4 mg Oral PRN Adonis Brook, NP      . magnesium hydroxide (MILK OF MAGNESIA) suspension 30 mL  30 mL Oral Daily PRN Charm Rings, NP      . methocarbamol (ROBAXIN) tablet 500 mg  500 mg Oral Q8H PRN Adonis Brook, NP      . nicotine polacrilex (NICORETTE) gum 2 mg  2 mg Oral PRN Craige Cotta, MD      . ondansetron (ZOFRAN-ODT) disintegrating tablet 4 mg  4 mg Oral Q6H PRN Adonis Brook, NP      . zolpidem (AMBIEN) tablet 5 mg  5 mg Oral QHS PRN Craige Cotta, MD        Lab Results:  Results for orders placed or performed during the hospital encounter of 04/13/16 (from the past 48 hour(s))  CBC with Differential/Platelet     Status: None   Collection Time: 04/14/16   6:20 PM  Result Value Ref Range   WBC 4.6 4.0 - 10.5 K/uL   RBC 4.61 4.22 - 5.81 MIL/uL   Hemoglobin 13.9 13.0 - 17.0 g/dL   HCT 16.1 09.6 - 04.5 %   MCV 88.3 78.0 - 100.0 fL   MCH 30.2 26.0 - 34.0 pg   MCHC 34.2 30.0 - 36.0 g/dL   RDW 40.9 81.1 - 91.4 %   Platelets 224 150 - 400 K/uL   Neutrophils Relative % 51 %  Neutro Abs 2.3 1.7 - 7.7 K/uL   Lymphocytes Relative 36 %   Lymphs Abs 1.6 0.7 - 4.0 K/uL   Monocytes Relative 10 %   Monocytes Absolute 0.5 0.1 - 1.0 K/uL   Eosinophils Relative 3 %   Eosinophils Absolute 0.2 0.0 - 0.7 K/uL   Basophils Relative 0 %   Basophils Absolute 0.0 0.0 - 0.1 K/uL    Comment: Performed at [facility], [address], [place], [place] [address]  TSH     Status: None   Collection Time: 04/15/16  6:41 AM  Result Value Ref Range   TSH 2.132 0.350 - 4.500 uIU/mL    Comment: Performed by a 3rd Generation assay with a functional sensitivity of <=0.01 uIU/mL. Performed at [facility], [address], [place], [place] [address]     Blood Alcohol level:  Lab Results  Component Value Date   [facility] <5 04/11/2016   ETH <11 06/30/2011   Metabolic Disorder Labs: No results found for: HGBA1C, MPG No results found for: PROLACTIN No results found for: CHOL, TRIG, HDL, CHOLHDL, VLDL, LDLCALC  Physical Findings: AIMS: Facial and Oral Movements Muscles of Facial Expression: None, normal Lips and Perioral Area: None, normal Jaw: None, normal Tongue: None, normal,Extremity Movements Upper (arms, wrists, hands, fingers): None, normal Lower (legs, knees, ankles, toes): None, normal, Trunk Movements Neck, shoulders, hips: None, normal, Overall Severity Severity of abnormal movements (highest score from questions above): None, normal Incapacitation due to abnormal movements: None, normal Patient's awareness of abnormal movements (rate only patient's report): No Awareness, Dental Status Current problems with teeth  and/or dentures?: No Does patient usually wear dentures?: No  CIWA:  CIWA-Ar Total: 1 COWS:  COWS Total Score: 1  Musculoskeletal: Strength & Muscle Tone: within normal limits Gait & Station: normal Patient leans: N/A  Psychiatric Specialty Exam: Physical Exam  ROS  Blood pressure 129/77, pulse 63, temperature 98.4 F (36.9 C), temperature source Oral, resp. rate 16, height 6' (1.829 m), weight 63 kg (139 lb).Body mass index is 18.85 kg/m.  General Appearance: Fairly Groomed  Eye Contact:  Fair  Speech:  Normal Rate  Volume:  Normal  Mood:  states mood is improving   Affect:  vaguely irritable   Thought Process:  Linear  Orientation:  Full (Time, Place, and Person)  Thought Content:  denies hallucinations, no delusions   Suicidal Thoughts:  No- denies any suicidal or self injurious ideations  Homicidal Thoughts:  No denies any violent or homicidal ideations   Memory:  recent and remote grossly intact   Judgement:  Fair  Insight:  Fair  Psychomotor Activity:  Normal  Concentration:  Concentration: Good and Attention Span: Good  Recall:  Good  Fund of Knowledge:  Good  Language:  Good  Akathisia:  Negative  Handed:  Right  AIMS (if indicated):     Assets:  Desire for Improvement Resilience  ADL's:  Intact  Cognition:  WNL  Sleep:  Number of Hours: 6.25     Assessment: Dustin Owens is a 21 year old caucasian male with hx of polysubstance dependence. He is a patient here receiving mood stabilization treatment after an overdose attempt on Celexa. He recently relapsed on Cocaine due to familial stress. He has hx of Bipolar affective disorder. Was on Seroquel, stopped taking it due to inability to afford this medication.  Treatment Plan Summary:  Daily contact with patient to assess and evaluate symptoms and progress in treatment and Medication management  Reviewed past medical records & treatment plan.   For Mood stabilization: Will continue Tegretol 200 mg po daily.  For  Anxiety disorder: Will increase Hydroxyzine to 50 mg po Q 6 hours prn.  For insomnia: Will initiate Zolpidem 5 mg po qhs prn.  Other medical issues & concerns: Will continue prn medications as recommended.  - Continue 15 minutes observation for safety concerns - Encouraged to participate in milieu therapy and group therapy counseling sessions and also work with coping skills -  Develop treatment plan to reduce the need for readmission. -  Psycho-social education regarding self care. - Health care follow up as needed for medical problems. - Restart home medications where appropriate.  Sanjuana KavaNwoko, Agnes I, NP, PMHNP, FNP-BC 04/15/2016, 4:40 PM   Agree with NP progress note

## 2016-04-15 NOTE — Progress Notes (Signed)
D:  Patient is focused on ativan.  He has requested that nursing staff speak with MD regarding an order.  Informed patient yesterday that MD refused to order ativan for him.  He has been receiving vistaril 50 mg every 6 hours.  Patient was also focused on his Remus Lofflerambien which was discontinued yesterday.  He states, "I can't take trazodone.  It gave me really bad dreams."  Patient has minimal insight regarding his substance abuse, as he feels he does not have a problem.  Patient rates his depression and hopelessness as a 3; anxiety as a 7.  His goal today is to "calm the anxiety."  He denies any thoughts of self harm. A: Continue to monitor medication management and MD orders.  Safety checks completed every 15 minutes per protocol.  Offer support and encouragement as needed. R: Patient is receptive to staff; his behavior is appropriate.

## 2016-04-16 DIAGNOSIS — F1410 Cocaine abuse, uncomplicated: Secondary | ICD-10-CM

## 2016-04-16 DIAGNOSIS — R45851 Suicidal ideations: Secondary | ICD-10-CM

## 2016-04-16 NOTE — Progress Notes (Signed)
D: Patient states that he will like to be discharged tomorrow as early as possible because he has so many things to do like paying his bills. Denies pain, SI/HI, AH/VH at this time. No behavioral issues noted.   A: Staff offered support ans encouragement as needed. Due/prn meds given as ordered. Every 15 minutes check for safety maintained. Will continue to monitor patient.  R: Patient remains safe.

## 2016-04-16 NOTE — Plan of Care (Signed)
Problem: Activity: Goal: Sleeping patterns will improve Outcome: Progressing Pt slept 6.25 hours last night according to flowsheet.    

## 2016-04-16 NOTE — Progress Notes (Signed)
Patient ID: Dustin Owens, male   DOB: 12/29/1995, 21 y.o.   MRN: [id number] D) Pt has been labile, intrusive at times. Pt was isolative to room this a.m. C/o "generalized" body aches, nausea, and anxiety, as well as decreased appetite. Pt has been positive for afternoon activities with minimal prompting. Pt insight limited.  Pt  ate adequate amount at lunch. A) level 3 obs for safety, support and reassurance provided. Encourage use of coping skills. Med ed reinforced. R) Receptive.

## 2016-04-16 NOTE — Progress Notes (Signed)
D: Pt presents with anxious affect and mood.  He describes his day by stating "I'm alive."  Pt discussed how "when you talk a lot and make people laugh, they think you're not depressed."  Pt has been manipulative and sarcastic.  He reports "oh they were supposed to up the Vistaril to 75, I guess it must be an error in the system."  Denies SI/HI, denies hallucinations, denies pain.  A: Support and encouragement offered.  Q15 minute safety checks completed.  PRN medication administered for sleep and anxiety.  R: Pt is safe on the unit.  He verbally contracts for safety.

## 2016-04-16 NOTE — Progress Notes (Signed)
[facility]  04/16/2016 1:37 PM Dustin Brunnerrenton A Owens  MRN:  [id number]  Subjective: patient reports " I am ready to go, my wife can come a get me."   Objective: Fraser A Kreeger is awake, alert and oriented *3. Seen resting in bedroom  Denies suicidal or homicidal ideation. Denies auditory or visual hallucination and does not appear to be responding to internal stimuli. Patient interacts well with staff and others. Patient reports he is medication compliant without mediation side effects. Patient reports feeling better than when he came into the hospital.  States his depression 5/10. Reports good appetite and reports resting well. Patient report he is excited regarding discharge. Support, encouragement and reassurance was provided.   Principal Problem: Suicide attempt by drug ingestion [facility](HCC)  Diagnosis:   Patient Active Problem List   Diagnosis Date Noted  . Bipolar affective disorder, depressed, severe (HCC) [F31.4] 04/14/2016  . Suicide attempt by drug ingestion (HCC) [T50.902A]   . Cocaine abuse [F14.10] 04/12/2016  . Severe recurrent major depression with psychotic features (HCC) [F33.3] 07/01/2011  . 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 Polysubstance dependence (HCC) [F19.20] 07/01/2011   Total Time spent with patient: 25 minutes  Past Psychiatric History: Polysubstance dependence  Past Medical History:  Past Medical History:  Diagnosis Date  . Allergy   . Anxiety   . Depression   . Headache(784.0)   . Vision abnormalities     Past Surgical History:  Procedure Laterality Date  . NO PAST SURGERIES     Family History:  Family History  Problem Relation Age of Onset  . OCD Mother   . Drug abuse Mother   . Schizophrenia Father   . Drug abuse Father    Family Psychiatric  History: See H&P  Social History:  History  Alcohol Use  . 0.5 oz/week  . 1 Standard drinks or equivalent per week    Comment: 1 - 1/2 5ths of bourbon     History  Drug  Use  . Frequency: 7.0 times per week  . Types: Amphetamines, Benzodiazepines, Cocaine, Codeine, Fentanyl, Hashish, Heroin, Hydrocodone, Hydromorphone, LSD, Marijuana, MDMA (Ecstacy), Methamphetamines, Morphine, Nitrous oxide, Opium, PCP, Solvent inhalants    Social History   Social History  . Marital status: Single    Spouse name: N/A  . Number of children: N/A  . Years of education: N/A   Occupational History  . student     11th grade at a [employer] facility   Social History Main Topics  . Smoking status: Current Every Day Smoker    Packs/day: 1.00    Years: 5.00    Types: Cigarettes  . Smokeless tobacco: Never Used  . Alcohol use 0.5 oz/week    1 Standard drinks or equivalent per week     Comment: 1 - 1/2 5ths of bourbon  . Drug use: Yes    Frequency: 7.0 times per week    Types: Amphetamines, Benzodiazepines, Cocaine, Codeine, Fentanyl, Hashish, Heroin, Hydrocodone, Hydromorphone, LSD, Marijuana, MDMA (Ecstacy), Methamphetamines, Morphine, Nitrous oxide, Opium, PCP, Solvent inhalants  . Sexual activity: Yes    Partners: Female    Birth control/ protection: Condom   Other Topics Concern  . None   Social History Narrative  . None   Additional Social History:    Pain Medications: pt denies Prescriptions: pt denies Over the Counter: pt denies History of alcohol / drug use?: Yes Longest period of sobriety (when/how long): 2 weeks Negative Consequences of Use: Legal Withdrawal  Symptoms: Agitation, Aggressive/Assaultive, Fever / Chills, Irritability, Sweats, Tingling Name of Substance 1: alcohol 1 - Age of First Use: 10 1 - Amount (size/oz): 6 beers to half a gallon 1 - Frequency: weekly 1 - Duration: since age 80 1 - Last Use / Amount: 04/10/16, 40 oz Name of Substance 2: Cocaine 2 - Age of First Use: 14 2 - Amount (size/oz): 2 grams 2 - Frequency: weekly 2 - Duration: unknown 2 - Last Use / Amount: 2 days ago, $40  Sleep: "I'm not sleeping well"  Appetite:   Fair  Current Medications: Current Facility-Administered Medications  Medication Dose Route Frequency Provider Last Rate Last Dose  . acetaminophen (TYLENOL) tablet 650 mg  650 mg Oral Q6H PRN Charm Rings, NP      . alum & mag hydroxide-simeth (MAALOX/MYLANTA) 200-200-20 MG/5ML suspension 30 mL  30 mL Oral Q4H PRN Charm Rings, NP      . carbamazepine (TEGRETOL) tablet 200 mg  200 mg Oral BID Charm Rings, NP   200 mg at 04/16/16 0905  . dicyclomine (BENTYL) tablet 20 mg  20 mg Oral Q6H PRN Adonis Brook, NP      . hydrOXYzine (ATARAX/VISTARIL) tablet 50 mg  50 mg Oral Q6H PRN Adonis Brook, NP   50 mg at 04/16/16 1114  . ibuprofen (ADVIL,MOTRIN) tablet 600 mg  600 mg Oral Q8H PRN Charm Rings, NP   600 mg at 04/16/16 1114  . loperamide (IMODIUM) capsule 2-4 mg  2-4 mg Oral PRN Adonis Brook, NP      . magnesium hydroxide (MILK OF MAGNESIA) suspension 30 mL  30 mL Oral Daily PRN Charm Rings, NP      . methocarbamol (ROBAXIN) tablet 500 mg  500 mg Oral Q8H PRN Adonis Brook, NP   500 mg at 04/16/16 0906  . nicotine polacrilex (NICORETTE) gum 2 mg  2 mg Oral PRN Craige Cotta, MD      . ondansetron (ZOFRAN-ODT) disintegrating tablet 4 mg  4 mg Oral Q6H PRN Adonis Brook, NP   4 mg at 04/16/16 1114  . zolpidem (AMBIEN) tablet 5 mg  5 mg Oral QHS PRN Craige Cotta, MD   5 mg at 04/15/16 2107    Lab Results:  Results for orders placed or performed during the hospital encounter of 04/13/16 (from the past 48 hour(s))  CBC with Differential/Platelet     Status: None   Collection Time: 04/14/16  6:20 PM  Result Value Ref Range   WBC 4.6 4.0 - 10.5 K/uL   RBC 4.61 4.22 - 5.81 MIL/uL   Hemoglobin 13.9 13.0 - 17.0 g/dL   HCT 96.0 45.4 - 09.8 %   MCV 88.3 78.0 - 100.0 fL   MCH 30.2 26.0 - 34.0 pg   MCHC 34.2 30.0 - 36.0 g/dL   RDW 11.9 14.7 - 82.9 %   Platelets 224 150 - 400 K/uL   Neutrophils Relative % 51 %   Neutro Abs 2.3 1.7 - 7.7 K/uL   Lymphocytes Relative 36 %    Lymphs Abs 1.6 0.7 - 4.0 K/uL   Monocytes Relative 10 %   Monocytes Absolute 0.5 0.1 - 1.0 K/uL   Eosinophils Relative 3 %   Eosinophils Absolute 0.2 0.0 - 0.7 K/uL   Basophils Relative 0 %   Basophils Absolute 0.0 0.0 - 0.1 K/uL    Comment: Performed at [facility], [address] [place], [place] [address]  TSH  Status: None   Collection Time: 04/15/16  6:41 AM  Result Value Ref Range   TSH 2.132 0.350 - 4.500 uIU/mL    Comment: Performed by a 3rd Generation assay with a functional sensitivity of <=0.01 uIU/mL. Performed at [facility], [address], [place], [place] [address]     Blood Alcohol level:  Lab Results  Component Value Date   [facility] <5 04/11/2016   ETH <11 06/30/2011   Metabolic Disorder Labs: No results found for: HGBA1C, MPG No results found for: PROLACTIN No results found for: CHOL, TRIG, HDL, CHOLHDL, VLDL, LDLCALC  Physical Findings: AIMS: Facial and Oral Movements Muscles of Facial Expression: None, normal Lips and Perioral Area: None, normal Jaw: None, normal Tongue: None, normal,Extremity Movements Upper (arms, wrists, hands, fingers): None, normal Lower (legs, knees, ankles, toes): None, normal, Trunk Movements Neck, shoulders, hips: None, normal, Overall Severity Severity of abnormal movements (highest score from questions above): None, normal Incapacitation due to abnormal movements: None, normal Patient's awareness of abnormal movements (rate only patient's report): No Awareness, Dental Status Current problems with teeth and/or dentures?: No Does patient usually wear dentures?: No  CIWA:  CIWA-Ar Total: 1 COWS:  COWS Total Score: 4  Musculoskeletal: Strength & Muscle Tone: within normal limits Gait & Station: normal Patient leans: N/A  Psychiatric Specialty Exam: Physical Exam  Nursing note and vitals reviewed. Constitutional: He is oriented to person, place, and time. He appears well-developed.   Neurological: He is alert and oriented to person, place, and time.  Psychiatric: He has a normal mood and affect. His behavior is normal.    Review of Systems  Psychiatric/Behavioral: Positive for depression (improving ) and substance abuse. Nervous/anxious: stable.     Blood pressure 137/73, pulse 67, temperature 97.6 F (36.4 C), temperature source Oral, resp. rate 16, height 6' (1.829 m), weight 63 kg (139 lb).Body mass index is 18.85 kg/m.  General Appearance: Fairly Groomed  Eye Contact:  Fair  Speech:  Normal Rate  Volume:  Normal  Mood:  states mood is improving   Affect:  vaguely irritable   Thought Process:  Linear  Orientation:  Full (Time, Place, and Person)  Thought Content:  denies hallucinations, no delusions   Suicidal Thoughts:  No  Homicidal Thoughts:  No    Memory:  recent and remote grossly intact   Judgement:  Fair  Insight:  Fair  Psychomotor Activity:  Normal  Concentration:  Concentration: Good and Attention Span: Good  Recall:  Good  Fund of Knowledge:  Good  Language:  Good  Akathisia:  Negative  Handed:  Right  AIMS (if indicated):     Assets:  Desire for Improvement Resilience  ADL's:  Intact  Cognition:  WNL  Sleep:  Number of Hours: 6.25        I agree with current treatment plan on 04/16/2016, Patient seen face-to-face for psychiatric evaluation follow-up, chart reviewed. Reviewed the information documented and agree with the treatment plan.   Treatment Plan Summary:  Daily contact with patient to assess and evaluate symptoms and progress in treatment and Medication management  Reviewed past medical records & treatment plan.   For Mood stabilization: Will continue Tegretol 200 mg po daily.  For Anxiety disorder: Will increase Hydroxyzine to 50 mg po Q 6 hours prn.  For insomnia: Continue Zolpidem 5 mg po qhs prn.  Other medical issues & concerns: Will continue prn medications as recommended.  - Continue 15 minutes observation  for safety concerns -  Encouraged to participate in milieu therapy and group therapy counseling sessions and also work with coping skills -  Develop treatment plan to reduce the need for readmission. -  Psycho-social education regarding self care. - Health care follow up as needed for medical problems. - Restart home medications where appropriate.  Oneta Rack, NP 04/16/2016, 1:37 PM   Agree with NP progress note

## 2016-04-16 NOTE — [facility] Group Notes (Signed)
[facility] LCSW Group Therapy  04/16/2016 2:40 PM  Type of Therapy:  Group Therapy  Participation Level:  Active  Participation Quality:  Appropriate and Attentive  Affect:  Appropriate  Cognitive:  Alert and Appropriate  Insight:  Developing/Improving  Engagement in Therapy:  Engaged  Modes of Intervention:  Discussion, Exploration and Support  Summary of Progress/Problems:  Finding Balance in Life. Today's group focused on defining balance in one's own words, identifying things that can knock one off balance, and exploring healthy ways to maintain balance in life. Group members were asked to provide an example of a time when they felt off balance, describe how they handled that situation, and process healthier ways to regain balance in the future. Group members were asked to share the most important tool for maintaining balance that they learned while [facility] and how they plan to apply this method after discharge. Patient had difficulty fully engaging in group; however did discuss conflict between "my job which pays more" and his need to be home to relieve girlfriend and allow her to go to work.  Appeared to listen to contributions of others who discussed long term negative impact of addiction on job/family/personal life.   Santa Generanne Cunningham LCSW 04/16/16

## 2016-04-17 MED ORDER — CARBAMAZEPINE 200 MG PO TABS: 200.0000 mg | ORAL_TABLET | Freq: Two times a day (BID) | ORAL | 0 refills | Status: DC

## 2016-04-17 MED ORDER — ZOLPIDEM TARTRATE 5 MG PO TABS: 5.0000 mg | ORAL_TABLET | Freq: Every evening | ORAL | 0 refills | Status: DC | PRN

## 2016-04-17 MED ORDER — NICOTINE POLACRILEX 2 MG MT GUM: 2.0000 mg | CHEWING_GUM | OROMUCOSAL | 0 refills | Status: DC | PRN

## 2016-04-17 MED ORDER — HYDROXYZINE HCL 50 MG PO TABS: 50.0000 mg | ORAL_TABLET | Freq: Four times a day (QID) | ORAL | 0 refills | Status: DC | PRN

## 2016-04-17 NOTE — Discharge Summary (Signed)
Physician Discharge Summary Note  Patient:  Dustin Owens is an 21 y.o., male MRN:  [id number] DOB:  [birth year] Patient phone:  [phone] (home)  Patient address:   [address] [place] [address],   Total Time spent with patient: Greater than 30 minutes  Date of Admission:  04/13/2016  Date of Discharge: 04-17-16  Reason for Admission: Suicide attempt by overdose.  Principal Problem: Bipolar affective disorder, severe, depressed. Discharge Diagnoses: Patient Active Problem List   Diagnosis Date Noted  . Bipolar affective disorder, depressed, severe (HCC) [F31.4] 04/14/2016  . Suicide attempt by drug ingestion (HCC) [T50.902A]   . Cocaine abuse [F14.10] 04/12/2016  . Severe recurrent major depression with psychotic features (HCC) [F33.3] 07/01/2011  . 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 Polysubstance dependence [facility]) [F19.20] 07/01/2011   Past Psychiatric History: Bipolar affective disorder, depressed, severe.  Past Medical History:  Past Medical History:  Diagnosis Date  . Allergy   . Anxiety   . Depression   . Headache(784.0)   . Vision abnormalities     Past Surgical History:  Procedure Laterality Date  . NO PAST SURGERIES     Family History:  Family History  Problem Relation Age of Onset  . OCD Mother   . Drug abuse Mother   . Schizophrenia Father   . Drug abuse Father    Family Psychiatric  History: See H&P  Social History:  History  Alcohol Use  . 0.5 oz/week  . 1 Standard drinks or equivalent per week    Comment: 1 - 1/2 5ths of bourbon     History  Drug Use  . Frequency: 7.0 times per week  . Types: Amphetamines, Benzodiazepines, Cocaine, Codeine, Fentanyl, Hashish, Heroin, Hydrocodone, Hydromorphone, LSD, Marijuana, MDMA (Ecstacy), Methamphetamines, Morphine, Nitrous oxide, Opium, PCP, Solvent inhalants    Social History   Social History  . Marital status: Single    Spouse name: N/A  . Number of  children: N/A  . Years of education: N/A   Occupational History  . student     11th grade at a [employer] facility   Social History Main Topics  . Smoking status: Current Every Day Smoker    Packs/day: 1.00    Years: 5.00    Types: Cigarettes  . Smokeless tobacco: Never Used  . Alcohol use 0.5 oz/week    1 Standard drinks or equivalent per week     Comment: 1 - 1/2 5ths of bourbon  . Drug use: Yes    Frequency: 7.0 times per week    Types: Amphetamines, Benzodiazepines, Cocaine, Codeine, Fentanyl, Hashish, Heroin, Hydrocodone, Hydromorphone, LSD, Marijuana, MDMA (Ecstacy), Methamphetamines, Morphine, Nitrous oxide, Opium, PCP, Solvent inhalants  . Sexual activity: Yes    Partners: Female    Birth control/ protection: Condom   Other Topics Concern  . None   Social History Narrative  . None   Hospital Course: Dustin Owens an 21 y.o.malewas brought to [facility] by EMS after ingesting 13 - 40 mg Celexa per nurse note.  He states that he does not want be here.  He has poor insight into the events of his Celexa overdose.  He is irritable and wants to be on Ativan.  Patient reports that he took his wife's  medication because he is suicidal.  He states that when he goes home all he hears is that he is good for nothing.  Then he also states that he wants to go home and not be here  because he is so anxious and that standing is painful for him.  Patient reports current stressors include money, relational conflict and issues at his job.  "I wish I could do my life over, now I have a baby and one on the way and a wife that nags me all the time".  Patient reports overdosing in an attempt at suicide when he was a teen.  Patient was inpatient at [facility] in 2013 on the adolescent unit.  Patient had been on Seroquel in 2014 and reports he was compliant for about 7 mos but stopped due to increased somnolence.  Dustin Owens was admitted to the hospital for worsening symptoms of depression & crisis management due to  suicide attempt by overdose. His UDS was positive for cocaine on admission & he admitted a recent relapse on this drug. Dustin Owens attributed his worsening depression to familial & financial stressors. He has hx of suicide attempt in his teen years & was hospitalized in the [facility] adolescence unit. He was in need of mood stabilization treatments.   After his admission assessment, Dustin Owens's presenting symptoms were identified. The medication regimen targeting those symptoms were initiated. He was medicated & discharged on; Tegretol 200 mg for mood stabilization, Hydroxyzine 50 mg prn for anxiety, Nicorette gum prescription for smoking cessation & Ambien 5 mg for insomnia. He presented no other pre-existing medical problems that required treatment. He was enrolled & participated in the group counseling sessions being offered & held on this unit. Dustin Owens learned Pharmacologist.  During the course of his hospitalization, Dustin Owens's improvement was monitored by observation & his daily report of symptom reduction noted. His emotional and mental status were monitored by daily self-inventory reports completed by him & the clinical staff. He was evaluated by the treatment team for mood stability and plans for continued recovery after discharge. Dustin Owens's motivation was an integral factor in his mood stability. He was offered further treatment options upon discharge on an outpatient basis as noted below.   Upon discharge, Dustin Owens was both mentally and medically stable. He denies suicidal/homicidal ideations, auditory/visual/tactile hallucinations, delusional thoughts or paranoia. he received from the [facility] pharmacy, a 7 days worth, supply samples of his [facility] discharge medications. He left [facility] with all personal belongings in no apparent distress. Transportation per girlfriend.  Physical Findings: AIMS: Facial and Oral Movements Muscles of Facial Expression: None, normal Lips and Perioral Area: None, normal Jaw: None,  normal Tongue: None, normal,Extremity Movements Upper (arms, wrists, hands, fingers): None, normal Lower (legs, knees, ankles, toes): None, normal, Trunk Movements Neck, shoulders, hips: None, normal, Overall Severity Severity of abnormal movements (highest score from questions above): None, normal Incapacitation due to abnormal movements: None, normal Patient's awareness of abnormal movements (rate only patient's report): No Awareness, Dental Status Current problems with teeth and/or dentures?: No Does patient usually wear dentures?: No  CIWA:  CIWA-Ar Total: 1 COWS:  COWS Total Score: 2  Musculoskeletal: Strength & Muscle Tone: within normal limits Gait & Station: normal Patient leans: N/A  Psychiatric Specialty Exam: Physical Exam  Constitutional: He is oriented to person, place, and time. He appears well-developed.  HENT:  Head: Normocephalic.  Eyes: Pupils are equal, round, and reactive to light.  Neck: Normal range of motion.  Cardiovascular: Normal rate.   Respiratory: Effort normal.  GI: Soft.  Genitourinary:  Genitourinary Comments: Denies any issues in this area  Musculoskeletal: Normal range of motion.  Neurological: He is alert and oriented to person, place, and time.  Skin: Skin is warm  and dry.    Review of Systems  Constitutional: Negative.   HENT: Negative.   Eyes: Negative.   Respiratory: Negative.   Cardiovascular: Negative.   Gastrointestinal: Negative.   Genitourinary: Negative.   Musculoskeletal: Negative.   Skin: Negative.   Neurological: Negative.   Endo/Heme/Allergies: Negative.   Psychiatric/Behavioral: Positive for depression (Stable) and substance abuse. Negative for hallucinations, memory loss and suicidal ideas. The patient has insomnia (Stable). The patient is not nervous/anxious.     Blood pressure 127/71, pulse 90, temperature 98.2 F (36.8 C), temperature source Oral, resp. rate 18, height 6' (1.829 m), weight 63 kg (139 lb).Body mass  index is 18.85 kg/m.  See Md's SRA   Have you used any form of tobacco in the last 30 days? (Cigarettes, Smokeless Tobacco, Cigars, and/or Pipes): Yes  Has this patient used any form of tobacco in the last 30 days? (Cigarettes, Smokeless Tobacco, Cigars, and/or Pipes): Yes, provided with Nicorette gum prescription.   Blood Alcohol level:  Lab Results  Component Value Date   ETH <5 04/11/2016   ETH <11 06/30/2011   Metabolic Disorder Labs:  No results found for: HGBA1C, MPG No results found for: PROLACTIN No results found for: CHOL, TRIG, HDL, CHOLHDL, VLDL, LDLCALC  See Psychiatric Specialty Exam and Suicide Risk Assessment completed by Attending Physician prior to discharge.  Discharge destination:  Home  Is patient on multiple antipsychotic therapies at discharge:  No   Has Patient had three or more failed trials of antipsychotic monotherapy by history:  No  Recommended Plan for Multiple Antipsychotic Therapies: NA  Allergies as of 04/17/2016      Reactions   Naproxen Nausea And Vomiting      Medication List    STOP taking these medications   QUEtiapine 400 MG 24 hr tablet Commonly known as:  SEROQUEL XR     TAKE these medications     Indication  carbamazepine 200 MG tablet Commonly known as:  TEGRETOL Take 1 tablet (200 mg total) by mouth 2 (two) times daily. For mood stabilization  Indication:  Mood stabilization   hydrOXYzine 50 MG tablet Commonly known as:  ATARAX/VISTARIL Take 1 tablet (50 mg total) by mouth every 6 (six) hours as needed for anxiety.  Indication:  Anxiety Neurosis   nicotine polacrilex 2 MG gum Commonly known as:  NICORETTE Take 1 each (2 mg total) by mouth as needed for smoking cessation.  Indication:  Nicotine Addiction   zolpidem 5 MG tablet Commonly known as:  AMBIEN Take 1 tablet (5 mg total) by mouth at bedtime as needed for sleep.  Indication:  Trouble Sleeping      Follow-up Information    [facility] Follow up on 04/29/2016.   Why:  Medications management appt w this provider on 1/31 at 8:45 AM w Dr Tenny Crawoss.  Please bring copy of insurance card and hospital discharge paperwork to this appointment.  Call to cancel/reschedule if needed.   Contact information: [address],  [place], [place] [address] Phone: [phone] Fax:  Provider [facility]       [facility] Follow up.   Why:  Office closed due to inclement weather. Please walk in within 7 days of discharge in order to be assessed for mental health services including: Medication management/counseling/substance abuse outpatient treatment. Walk in between 8am-9am M-F.  Contact information: [address] [place] [address] [phone]          Follow-up recommendations:  Activity:  As tolerated Diet: As recommended by your primary care doctor. Keep all scheduled follow-up appointments as recommended.  Comments: Patient is instructed prior to discharge to: Take all medications as prescribed by his/her mental healthcare provider. Report any adverse effects and or reactions from the medicines to his/her outpatient provider promptly. Patient has been instructed & cautioned: To not engage in alcohol and or illegal drug use while on prescription medicines. In the event of worsening symptoms, patient is instructed to call the crisis hotline, 911 and or go to the nearest ED for appropriate evaluation and treatment of symptoms. To follow-up with his/her primary care provider for your other medical issues, concerns and or health care needs.   Signed: Sanjuana Kava, NP, PMHNP, FNP-BC 04/17/2016, 11:06 AM

## 2016-04-17 NOTE — Plan of Care (Signed)
Problem: Education: Goal: Ability to state activities that reduce stress will improve Outcome: Progressing Nurse discussed anxiety/coping skills with patient.    

## 2016-04-17 NOTE — Progress Notes (Signed)
  [facility] Discharge Plan :  Will you be returning to the same living situation after discharge:  Yes,  home with girlfriend and child At discharge, do you have transportation home?: Yes,  pt's girlfriend will pick him up today. Do you have the ability to pay for your medications: Yes,  pt reports that he has BCBS (not in system and he does not have card on file or in his belongings).  Release of information consent forms completed and submitted to medical records by CSW.  Patient to Follow up at: Follow-up Information    [facility] Follow up on 04/29/2016.   Why:  Medications management appt w this provider on 1/31 at 8:45 AM w Dr Tenny Crawoss.  Please bring copy of insurance card and hospital discharge paperwork to this appointment.  Call to cancel/reschedule if needed.   Contact information: [address],  [place], [place] [address] Phone: [phone] Fax:  Provider [facility]       [facility] Follow up.   Why:  Office closed due to inclement weather. Please walk in within 7 days of discharge in order to be assessed for mental health services including: Medication management/counseling/substance abuse outpatient treatment. Walk in between 8am-9am M-F.  Contact information: [address] [place] [address] [phone]           Next level of care provider has [facility] Link:no  Safety Planning and Suicide Prevention discussed: Yes,  SPE completed with pt; pt declined to consent to family contact. SPI pamphlet and Mobile Crisis information provided.   Have you used any form of tobacco in the last 30 days? (Cigarettes, Smokeless Tobacco, Cigars, and/or Pipes): Yes  Has patient been referred to the Quitline?: Patient refused referral  Patient has been referred for addiction treatment: Yes  Julaine Zimny N Smart LCSW 04/17/2016, 10:30 AM

## 2016-04-17 NOTE — [facility] Suicide Risk Assessment (Signed)
[facility] INPATIENT:  Family/Significant Other Suicide Prevention Education  Suicide Prevention Education:  Patient Refusal for Family/Significant Other Suicide Prevention Education: The patient Dustin Owens has refused to provide written consent for family/significant other to be provided Family/Significant Other Suicide Prevention Education during admission and/or prior to discharge.  Physician notified.  SPE completed with pt, as pt refused to consent to family contact. SPI pamphlet provided to pt and pt was encouraged to share information with support network, ask questions, and talk about any concerns relating to SPE. Pt denies access to guns/firearms and verbalized understanding of information provided. Mobile Crisis information also provided to pt.   Clorene Nerio N Smart LCSW 04/17/2016, 8:34 AM

## 2016-04-17 NOTE — Progress Notes (Signed)
D:  Patient's self inventory sheet, patient sleeps good, sleep medication helpful.  Good appetite, normal energy level, good concentration.  Rated depression 2, hopeless 5.  Denied hopeless.  Denied withdrawals.  Denied SI.  Denied physical problems.  Denied pain.  Goal is to get home to beatiful baby.  Plans to get things together.  Does have discharge plans. A:  Medications administered per MD orders.  Emotional support and encouragement given patient. R:  Denied SI and HI, contracts for safety.  Denied A/V hallucinations.  Safety maintained with 15 minute checks.

## 2016-04-17 NOTE — [facility] Suicide Risk Assessment (Signed)
[facility]   Principal Problem: Suicide attempt by drug ingestion [facility](HCC) Discharge Diagnoses:  Patient Active Problem List   Diagnosis Date Noted  . Bipolar affective disorder, depressed, severe (HCC) [F31.4] 04/14/2016  . Suicide attempt by drug ingestion (HCC) [T50.902A]   . Cocaine abuse [F14.10] 04/12/2016  . Severe recurrent major depression with psychotic features (HCC) [F33.3] 07/01/2011  . 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 Polysubstance dependence (HCC) [F19.20] 07/01/2011    Total Time spent with patient: 30 minutes  Musculoskeletal: Strength & Muscle Tone: within normal limits Gait & Station: normal Patient leans: N/A  Psychiatric Specialty Exam: Review of Systems  Constitutional: Negative.   HENT: Negative.   Eyes: Negative.   Respiratory: Negative.   Cardiovascular: Negative.   Gastrointestinal: Negative.   Genitourinary: Negative.   Musculoskeletal: Negative.   Skin: Negative.   Neurological: Negative.   Endo/Heme/Allergies: Negative.   Psychiatric/Behavioral: Negative.     Blood pressure 127/71, pulse 90, temperature 98.2 F (36.8 C), temperature source Oral, resp. rate 18, height 6' (1.829 m), weight 63 kg (139 lb).Body mass index is 18.85 kg/m.  General Appearance: Calm and cooperative. good relatedness. approrpiate behavior. not internally stimulated.   Eye Contact::  Good  Speech:  Normal Rate409  Volume:  Normal  Mood:  Euthymic  Affect:  Appropriate and Full Range  Thought Process:  Goal Directed  Orientation:  Full (Time, Place, and Person)  Thought Content:  No thoughts of violence. No delusional theme. No craving for substances, no negative ruminations.   Suicidal Thoughts:  No  Homicidal Thoughts:  No  Memory:  Normal  Judgement:  Good  Insight:  Good  Psychomotor Activity:  Normal  Concentration:  Good  Recall:  Good  Fund of Knowledge:Good  Language: Good  Akathisia:  Negative   Handed:  Right  AIMS (if indicated):     Assets:  ArchitectCommunication Skills Financial Resources/Insurance Housing  Sleep:  Number of Hours: 6.25  Cognition: WNL  ADL's:  Intact   Clinical Assessment::   21 yo Caucasian male, lives with his girlfriend and their 838 months old child. Gainfully employed. Admitted on account of an overdose. Took his girlfriend's medication. Was intoxicated with cocaine at that time. Reported being stressed by the quality of their relationship and financial constraints. His girlfriend is expecting another baby. Patient tells me today that he was just overwhelmed and acted impulsively. Says at that time he felt his lifestyle was very mechanical as all he did was get up, go to work come home and get criticized. Says he used to abuse opioids but has been sober for more than four years. Tells me that he just wanted to use something to cope hence he tried cocaine. Patient regrets his actions. Says he has been able to sleep here and think about what he did. Says he cannot imagine how it would have affected their child. Says being away for the past couple of days has made him realize how much he loves his family. Say he is missing them a lot. His girlfriend does not use substances. They do not have weapons at home. Patient reports that he is in good spirits. Anxiety and depression has improved. He is tolerating tegretol well. No current evidence of depression. No evidence of psychosis. No evidence of mania. Nursing staff reports that he has been participating with unit activities. No behavioral issues. He has not expressed any thoughts of violence.  His girlfriend has been very supportive  and would be picking him up today. Treatment team agrees that he is at baseline and approves discharge.  Demographic Factors:  Male and Caucasian  Loss Factors: Quality of his current relationship  Historical Factors: Prior suicide attempts and Impulsivity  Risk Reduction Factors:    Responsible for children under 3 years of age, Sense of responsibility to family, Living with another person, especially a relative and Positive social support  Continued Clinical Symptoms:  He has come off the effects of cocaine. Associated affective symptoms has resolved. He is tolerating Carbamazepine well. This is controlling impulsivity and explosiveness.   Cognitive Features That Contribute To Risk:  None    Suicide Risk:  Currently he is at a very low risk of suicide. Having a stable home, income and supportive family is protective. Substance use and impulsivity has been targeted during current admission.  Follow-up Information    [facility] Follow up on 04/29/2016.   Why:  Medications management appt w this provider on 1/31 at 8:45 AM w Dr Tenny Craw.  Please bring copy of insurance card and hospital discharge paperwork to this appointment.  Call to cancel/reschedule if needed.   Contact information: [address] [place]  [place], [place] [address] Phone: [phone] Fax:  Provider on [facility] Follow up.   Why:  Office closed due to inclement weather. Please walk in within 7 days of discharge in order to be assessed for mental health services including: Medication management/counseling/substance abuse outpatient treatment. Walk in between 8am-9am M-F.  Contact information: [address]  [place] [address] [phone]           Plan Of Care/Follow-up recommendations:   1. Continue current psychotropic medications 2. Outpatient follow up for mental health and addiction treatment.   Georgiann Cocker, MD 04/17/2016, 11:32 AM

## 2016-04-17 NOTE — Progress Notes (Signed)
Discharge D- Patient verbalizes readiness for discharge: Denies SI/HI. Does not appear psychotic or delusional. A- Discharge instructions read and discussed with patient.  All belongings returned to patient. R- Patient cooperative with discharge process.  Patient verbalizes understanding of discharge instructions.  Signed for return of belongings. Escorted to the lobby.

## 2016-04-17 NOTE — Tx Team (Signed)
Interdisciplinary Treatment and Diagnostic Plan Update  04/17/2016 Time of Session: 9:30AM Dustin Owens MRN: [id number]  Principal Diagnosis: Suicide attempt by drug ingestion Dustin Owens [name])  Secondary Diagnoses: Principal Problem:   Suicide attempt by drug ingestion [name]) Active Problems:   Bipolar affective disorder, depressed, severe ([place])   Current Medications:  Current Facility-Administered Medications  Medication Dose Route Frequency Provider Last Rate Last Dose  . acetaminophen (TYLENOL) tablet 650 mg  650 mg Oral Q6H PRN Patrecia Pour, NP      . alum & mag hydroxide-simeth (MAALOX/MYLANTA) 200-200-20 MG/5ML suspension 30 mL  30 mL Oral Q4H PRN Patrecia Pour, NP      . carbamazepine (TEGRETOL) tablet 200 mg  200 mg Oral BID Patrecia Pour, NP   200 mg at 04/17/16 0801  . dicyclomine (BENTYL) tablet 20 mg  20 mg Oral Q6H PRN Kerrie Buffalo, NP      . hydrOXYzine (ATARAX/VISTARIL) tablet 50 mg  50 mg Oral Q6H PRN Kerrie Buffalo, NP   50 mg at 04/17/16 0808  . ibuprofen (ADVIL,MOTRIN) tablet 600 mg  600 mg Oral Q8H PRN Patrecia Pour, NP   600 mg at 04/16/16 1114  . loperamide (IMODIUM) capsule 2-4 mg  2-4 mg Oral PRN Kerrie Buffalo, NP      . magnesium hydroxide (MILK OF MAGNESIA) suspension 30 mL  30 mL Oral Daily PRN Patrecia Pour, NP      . methocarbamol (ROBAXIN) tablet 500 mg  500 mg Oral Q8H PRN Kerrie Buffalo, NP   500 mg at 04/17/16 0807  . nicotine polacrilex (NICORETTE) gum 2 mg  2 mg Oral PRN Jenne Campus, MD   2 mg at 04/16/16 1954  . ondansetron (ZOFRAN-ODT) disintegrating tablet 4 mg  4 mg Oral Q6H PRN Kerrie Buffalo, NP   4 mg at 04/16/16 1114  . zolpidem (AMBIEN) tablet 5 mg  5 mg Oral QHS PRN Jenne Campus, MD   5 mg at 04/16/16 2141   PTA Medications: Prescriptions Prior to Admission  Medication Sig Dispense Refill Last Dose  . QUEtiapine (SEROQUEL XR) 400 MG 24 hr tablet Take 1 tablet (400 mg total) by mouth daily. 30 tablet 0     Patient Stressors:  Arts development officer issue Marital or family conflict Occupational concerns Substance abuse  Patient Strengths: Network engineer for treatment/growth  Treatment Modalities: Medication Management, Group therapy, Case management,  1 to 1 session with clinician, Psychoeducation, Recreational therapy.   Physician Treatment Plan for Primary Diagnosis: Suicide attempt by drug ingestion ([name]) Long Term Goal(s): Improvement in symptoms so as ready for discharge Improvement in symptoms so as ready for discharge   Short Term Goals: Ability to identify changes in lifestyle to reduce recurrence of condition will improve Ability to verbalize feelings will improve Ability to disclose and discuss suicidal ideas Ability to demonstrate self-control will improve Ability to identify and develop effective coping behaviors will improve Ability to maintain clinical measurements within normal limits will improve Compliance with prescribed medications will improve Ability to identify triggers associated with substance abuse/mental health issues will improve Ability to identify changes in lifestyle to reduce recurrence of condition will improve Ability to verbalize feelings will improve Ability to disclose and discuss suicidal ideas Ability to demonstrate self-control will improve Ability to identify and develop effective coping behaviors will improve Ability to maintain clinical measurements within normal limits will improve Compliance with prescribed medications will improve Ability to identify triggers associated  with substance abuse/mental health issues will improve  Medication Management: Evaluate patient's response, side effects, and tolerance of medication regimen.  Therapeutic Interventions: 1 to 1 sessions, Unit Group sessions and Medication administration.  Evaluation of Outcomes: Met  Physician Treatment Plan for Secondary Diagnosis:  Principal Problem:   Suicide attempt by drug ingestion ([name]) Active Problems:   Bipolar affective disorder, depressed, severe ([name])  Long Term Goal(s): Improvement in symptoms so as ready for discharge Improvement in symptoms so as ready for discharge   Short Term Goals: Ability to identify changes in lifestyle to reduce recurrence of condition will improve Ability to verbalize feelings will improve Ability to disclose and discuss suicidal ideas Ability to demonstrate self-control will improve Ability to identify and develop effective coping behaviors will improve Ability to maintain clinical measurements within normal limits will improve Compliance with prescribed medications will improve Ability to identify triggers associated with substance abuse/mental health issues will improve Ability to identify changes in lifestyle to reduce recurrence of condition will improve Ability to verbalize feelings will improve Ability to disclose and discuss suicidal ideas Ability to demonstrate self-control will improve Ability to identify and develop effective coping behaviors will improve Ability to maintain clinical measurements within normal limits will improve Compliance with prescribed medications will improve Ability to identify triggers associated with substance abuse/mental health issues will improve     Medication Management: Evaluate patient's response, side effects, and tolerance of medication regimen.  Therapeutic Interventions: 1 to 1 sessions, Unit Group sessions and Medication administration.  Evaluation of Outcomes: Met   RN Treatment Plan for Primary Diagnosis: Suicide attempt by drug ingestion ([name]) Long Term Goal(s): Knowledge of disease and therapeutic regimen to maintain health will improve  Short Term Goals: Ability to remain free from injury will improve, Ability to participate in decision making will improve and Ability to verbalize feelings will improve  Medication  Management: RN will administer medications as ordered by provider, will assess and evaluate patient's response and provide education to patient for prescribed medication. RN will report any adverse and/or side effects to prescribing provider.  Therapeutic Interventions: 1 on 1 counseling sessions, Psychoeducation, Medication administration, Evaluate responses to treatment, Monitor vital signs and CBGs as ordered, Perform/monitor CIWA, COWS, AIMS and Fall Risk screenings as ordered, Perform wound care treatments as ordered.  Evaluation of Outcomes: Met   LCSW Treatment Plan for Primary Diagnosis: Suicide attempt by drug ingestion [facility]) Long Term Goal(s): Safe transition to appropriate next level of care at discharge, Engage patient in therapeutic group addressing interpersonal concerns.  Short Term Goals: Engage patient in aftercare planning with referrals and resources, Facilitate patient progression through stages of change regarding substance use diagnoses and concerns and Identify triggers associated with mental health/substance abuse issues  Therapeutic Interventions: Assess for all discharge needs, 1 to 1 time with Social worker, Explore available resources and support systems, Assess for adequacy in community support network, Educate family and significant other(s) on suicide prevention, Complete Psychosocial Assessment, Interpersonal group therapy.  Evaluation of Outcomes: Met   Progress in Treatment: Attending groups: Yes Participating in groups:  Yes Taking medication as prescribed: Yes. Toleration medication: Yes. Family/Significant other contact made: SPE completed with pt; pt declined to consent to family contact.  Patient understands diagnosis: Yes. Discussing patient identified problems/goals with staff: Yes. Medical problems stabilized or resolved: Yes. Denies suicidal/homicidal ideation:Yes, per self report.  Issues/concerns per patient self-inventory: No. Other:  n/a  New problem(s) identified: No, Describe:  n/a  New Short Term/Long Term Goal(s):  Discharge Plan or Barriers: Pt plans to return home; states that he has [place] and wants to follow-up at [facility] in [place] made (med management only)> PT instructed to follow-up at [facility] if he does not have insurance. AA/NA information provided for [facility].   Reason for Continuation of Hospitalization: none  Estimated Length of Stay: d/c today  Attendees: Patient: 04/17/2016 10:32 AM  Physician: Dr. Sanjuana Letters MD 04/17/2016 10:32 AM  Nursing: Kieth Brightly RN; Rise Paganini RN 04/17/2016 10:32 AM  RN Care Manager: Rhunette Croft 04/17/2016 10:32 AM  Social Worker: Maxie Better, LCSW; Adriana Reams LCSW 04/17/2016 10:32 AM  Recreational Therapist:  04/17/2016 10:32 AM  Other: Onnie Boer NP; Samuel Jester NP 04/17/2016 10:32 AM  Other:  04/17/2016 10:32 AM  Other: 04/17/2016 10:32 AM    Scribe for Treatment Team: [place], LCSW 04/17/2016 10:32 AM

## 2016-04-29 ENCOUNTER — Encounter (HOSPITAL_COMMUNITY): Payer: Self-pay

## 2016-04-29 ENCOUNTER — Ambulatory Visit (HOSPITAL_COMMUNITY): Payer: Self-pay | Admitting: Psychiatry

## 2016-11-26 ENCOUNTER — Encounter (HOSPITAL_COMMUNITY): Payer: Self-pay | Admitting: Emergency Medicine

## 2016-11-26 DIAGNOSIS — F1410 Cocaine abuse, uncomplicated: Secondary | ICD-10-CM | POA: Insufficient documentation

## 2016-11-26 DIAGNOSIS — F17210 Nicotine dependence, cigarettes, uncomplicated: Secondary | ICD-10-CM | POA: Insufficient documentation

## 2016-11-26 DIAGNOSIS — R45851 Suicidal ideations: Secondary | ICD-10-CM | POA: Insufficient documentation

## 2016-11-26 DIAGNOSIS — F319 Bipolar disorder, unspecified: Secondary | ICD-10-CM | POA: Insufficient documentation

## 2016-11-26 DIAGNOSIS — Z79899 Other long term (current) drug therapy: Secondary | ICD-10-CM | POA: Insufficient documentation

## 2016-11-26 DIAGNOSIS — F419 Anxiety disorder, unspecified: Secondary | ICD-10-CM | POA: Insufficient documentation

## 2016-11-26 DIAGNOSIS — F919 Conduct disorder, unspecified: Secondary | ICD-10-CM | POA: Insufficient documentation

## 2016-11-26 LAB — CBC
HEMATOCRIT: 39.7 % (ref 39.0–52.0)
HEMOGLOBIN: 13.8 g/dL (ref 13.0–17.0)
MCH: 30 pg (ref 26.0–34.0)
MCHC: 34.8 g/dL (ref 30.0–36.0)
MCV: 86.3 fL (ref 78.0–100.0)
Platelets: 262 10*3/uL (ref 150–400)
RBC: 4.6 MIL/uL (ref 4.22–5.81)
RDW: 13.2 % (ref 11.5–15.5)
WBC: 5.3 10*3/uL (ref 4.0–10.5)

## 2016-11-26 NOTE — ED Triage Notes (Signed)
BIB EMS from home, pt has been having issues with family/fiance and has been on a drug binge for past 2-3 days. Pt took "50 pills" yesterday, states he does not know what the pills were. Also reports he did meth today. Per EMS pt left a suicide note, pt states he is not SI. For pt safety, placing in paper scrubs and being wanded. Belongings removed. Left notebook and pen with paper.

## 2016-11-27 ENCOUNTER — Emergency Department (HOSPITAL_COMMUNITY)
Admission: EM | Admit: 2016-11-27 | Discharge: 2016-11-27 | Disposition: A | Discharge status: Other Acute Inpatient Hospital | Payer: BLUE CROSS/BLUE SHIELD | Attending: Emergency Medicine | Admitting: Emergency Medicine

## 2016-11-27 ENCOUNTER — Encounter (HOSPITAL_COMMUNITY): Payer: Self-pay | Admitting: *Deleted

## 2016-11-27 ENCOUNTER — Inpatient Hospital Stay (HOSPITAL_COMMUNITY)
Admission: AD | Admit: 2016-11-27 | Discharge: 2016-12-02 | DRG: 885 | Disposition: A | Discharge status: Home / Self Care | Payer: Federal, State, Local not specified - Other | Source: Intra-hospital | Attending: Psychiatry | Admitting: Psychiatry

## 2016-11-27 DIAGNOSIS — Y902 Blood alcohol level of 40-59 mg/100 ml: Secondary | ICD-10-CM | POA: Diagnosis present

## 2016-11-27 DIAGNOSIS — F1094 Alcohol use, unspecified with alcohol-induced mood disorder: Secondary | ICD-10-CM | POA: Diagnosis present

## 2016-11-27 DIAGNOSIS — F1514 Other stimulant abuse with stimulant-induced mood disorder: Secondary | ICD-10-CM | POA: Diagnosis present

## 2016-11-27 DIAGNOSIS — F1994 Other psychoactive substance use, unspecified with psychoactive substance-induced mood disorder: Secondary | ICD-10-CM | POA: Diagnosis not present

## 2016-11-27 DIAGNOSIS — Z590 Homelessness: Secondary | ICD-10-CM

## 2016-11-27 DIAGNOSIS — Z630 Problems in relationship with spouse or partner: Secondary | ICD-10-CM | POA: Diagnosis not present

## 2016-11-27 DIAGNOSIS — T40602A Poisoning by unspecified narcotics, intentional self-harm, initial encounter: Secondary | ICD-10-CM | POA: Diagnosis present

## 2016-11-27 DIAGNOSIS — T43622A Poisoning by amphetamines, intentional self-harm, initial encounter: Secondary | ICD-10-CM | POA: Diagnosis present

## 2016-11-27 DIAGNOSIS — F319 Bipolar disorder, unspecified: Secondary | ICD-10-CM | POA: Diagnosis not present

## 2016-11-27 DIAGNOSIS — Z87891 Personal history of nicotine dependence: Secondary | ICD-10-CM

## 2016-11-27 DIAGNOSIS — F911 Conduct disorder, childhood-onset type: Secondary | ICD-10-CM | POA: Diagnosis present

## 2016-11-27 DIAGNOSIS — Z813 Family history of other psychoactive substance abuse and dependence: Secondary | ICD-10-CM

## 2016-11-27 DIAGNOSIS — Z886 Allergy status to analgesic agent status: Secondary | ICD-10-CM

## 2016-11-27 DIAGNOSIS — Z885 Allergy status to narcotic agent status: Secondary | ICD-10-CM

## 2016-11-27 DIAGNOSIS — Z560 Unemployment, unspecified: Secondary | ICD-10-CM | POA: Diagnosis not present

## 2016-11-27 DIAGNOSIS — R4589 Other symptoms and signs involving emotional state: Secondary | ICD-10-CM

## 2016-11-27 DIAGNOSIS — F419 Anxiety disorder, unspecified: Secondary | ICD-10-CM | POA: Diagnosis present

## 2016-11-27 DIAGNOSIS — R4689 Other symptoms and signs involving appearance and behavior: Secondary | ICD-10-CM

## 2016-11-27 DIAGNOSIS — Z818 Family history of other mental and behavioral disorders: Secondary | ICD-10-CM | POA: Diagnosis not present

## 2016-11-27 DIAGNOSIS — F1020 Alcohol dependence, uncomplicated: Secondary | ICD-10-CM | POA: Diagnosis not present

## 2016-11-27 LAB — COMPREHENSIVE METABOLIC PANEL
ALBUMIN: 4.5 g/dL (ref 3.5–5.0)
ALK PHOS: 98 U/L (ref 38–126)
ALT: 26 U/L (ref 17–63)
ANION GAP: 14 (ref 5–15)
AST: 26 U/L (ref 15–41)
BILIRUBIN TOTAL: 0.8 mg/dL (ref 0.3–1.2)
BUN: 13 mg/dL (ref 6–20)
CALCIUM: 9.6 mg/dL (ref 8.9–10.3)
CO2: 20 mmol/L — ABNORMAL LOW (ref 22–32)
Chloride: 103 mmol/L (ref 101–111)
Creatinine, Ser: 1.07 mg/dL (ref 0.61–1.24)
GLUCOSE: 82 mg/dL (ref 65–99)
POTASSIUM: 3.8 mmol/L (ref 3.5–5.1)
Sodium: 137 mmol/L (ref 135–145)
TOTAL PROTEIN: 7.5 g/dL (ref 6.5–8.1)

## 2016-11-27 LAB — ACETAMINOPHEN LEVEL: Acetaminophen (Tylenol), Serum: <10 ug/mL — ABNORMAL LOW (ref 10–30)

## 2016-11-27 LAB — RAPID URINE DRUG SCREEN, HOSP PERFORMED
Amphetamines: POSITIVE — AB
BARBITURATES: NOT DETECTED
Benzodiazepines: NOT DETECTED
COCAINE: NOT DETECTED
Opiates: POSITIVE — AB
TETRAHYDROCANNABINOL: NOT DETECTED

## 2016-11-27 LAB — CARBAMAZEPINE LEVEL, TOTAL: Carbamazepine Lvl: <2 ug/mL — ABNORMAL LOW (ref 4.0–12.0)

## 2016-11-27 LAB — ETHANOL: ALCOHOL ETHYL (B): 45 mg/dL — AB (ref <5)

## 2016-11-27 LAB — SALICYLATE LEVEL

## 2016-11-27 MED ORDER — NICOTINE 21 MG/24HR TD PT24
21.0000 mg | MEDICATED_PATCH | Freq: Every day | TRANSDERMAL | Status: DC
  Administered 2016-11-28 – 2016-12-02 (×5): 21 mg via TRANSDERMAL
  Filled 2016-11-27 (×9): qty 1

## 2016-11-27 MED ORDER — HYDROXYZINE HCL 50 MG/ML IM SOLN: 25.0000 mg | Freq: Four times a day (QID) | INTRAMUSCULAR | Status: DC | PRN

## 2016-11-27 MED ORDER — LORAZEPAM 1 MG PO TABS
1.0000 mg | ORAL_TABLET | Freq: Four times a day (QID) | ORAL | Status: AC | PRN
  Administered 2016-11-28 – 2016-11-30 (×4): 1 mg via ORAL
  Filled 2016-11-27 (×4): qty 1

## 2016-11-27 MED ORDER — ALUM & MAG HYDROXIDE-SIMETH 200-200-20 MG/5ML PO SUSP
30.0000 mL | ORAL | Status: DC | PRN
Start: 2016-11-27 — End: 2016-12-02

## 2016-11-27 MED ORDER — ACETAMINOPHEN 325 MG PO TABS
650.0000 mg | ORAL_TABLET | Freq: Four times a day (QID) | ORAL | Status: DC | PRN
  Administered 2016-11-28 – 2016-12-01 (×2): 650 mg via ORAL
  Filled 2016-11-27 (×2): qty 2

## 2016-11-27 MED ORDER — LORAZEPAM 1 MG PO TABS
1.0000 mg | ORAL_TABLET | Freq: Once | ORAL | Status: AC
  Administered 2016-11-27: 1 mg via ORAL
  Filled 2016-11-27: qty 1

## 2016-11-27 MED ORDER — NICOTINE 21 MG/24HR TD PT24
21.0000 mg | MEDICATED_PATCH | Freq: Once | TRANSDERMAL | Status: DC
  Administered 2016-11-27: 21 mg via TRANSDERMAL
  Filled 2016-11-27: qty 1

## 2016-11-27 MED ORDER — THIAMINE HCL 100 MG/ML IJ SOLN: 100.0000 mg | Freq: Every day | INTRAMUSCULAR | Status: DC

## 2016-11-27 MED ORDER — LORAZEPAM 2 MG/ML IJ SOLN
0.0000 mg | Freq: Two times a day (BID) | INTRAMUSCULAR | Status: DC
Start: 2016-11-29 — End: 2016-11-27

## 2016-11-27 MED ORDER — MAGNESIUM HYDROXIDE 400 MG/5ML PO SUSP: 30.0000 mL | Freq: Every day | ORAL | Status: DC | PRN

## 2016-11-27 MED ORDER — VITAMIN B-1 100 MG PO TABS
100.0000 mg | ORAL_TABLET | Freq: Every day | ORAL | Status: DC
  Administered 2016-11-27: 100 mg via ORAL
  Filled 2016-11-27: qty 1

## 2016-11-27 MED ORDER — LORAZEPAM 2 MG/ML IJ SOLN: 0.0000 mg | Freq: Four times a day (QID) | INTRAMUSCULAR | Status: DC

## 2016-11-27 MED ORDER — ACETAMINOPHEN 500 MG PO TABS
1000.0000 mg | ORAL_TABLET | Freq: Once | ORAL | Status: AC
  Administered 2016-11-27: 1000 mg via ORAL
  Filled 2016-11-27: qty 2

## 2016-11-27 MED ORDER — TRAZODONE HCL 50 MG PO TABS
50.0000 mg | ORAL_TABLET | Freq: Every evening | ORAL | Status: DC | PRN
Start: 2016-11-27 — End: 2016-12-02
  Administered 2016-11-29 – 2016-12-01 (×3): 50 mg via ORAL
  Filled 2016-11-27: qty 1
  Filled 2016-11-27: qty 7
  Filled 2016-11-27: qty 1

## 2016-11-27 MED ORDER — LORAZEPAM 1 MG PO TABS
0.0000 mg | ORAL_TABLET | Freq: Four times a day (QID) | ORAL | Status: DC
  Administered 2016-11-27: 1 mg via ORAL
  Filled 2016-11-27: qty 1

## 2016-11-27 MED ORDER — ENSURE ENLIVE PO LIQD
237.0000 mL | Freq: Two times a day (BID) | ORAL | Status: DC
  Administered 2016-11-28 – 2016-12-01 (×6): 237 mL via ORAL

## 2016-11-27 MED ORDER — LORAZEPAM 1 MG PO TABS: 0.0000 mg | ORAL_TABLET | Freq: Two times a day (BID) | ORAL | Status: DC

## 2016-11-27 NOTE — ED Notes (Signed)
Pt reports HA and "body aches." EDP made aware.

## 2016-11-27 NOTE — ED Notes (Signed)
Pt continuing to pace floor, keeps stating he wants to leave. This RN keeps talking him into staying and explained he must wait to see a doctor.

## 2016-11-27 NOTE — ED Provider Notes (Signed)
[facility] Dr. Jama Flavorsobos is accepting.   Pricilla LovelessGoldston, Ceilidh Torregrossa, MD 11/27/16 2022

## 2016-11-27 NOTE — ED Notes (Signed)
TTS in the room  

## 2016-11-27 NOTE — Tx Team (Signed)
Initial Treatment Plan 11/27/2016 10:10 PM Dustin Owens ZOX:[id number]    PATIENT STRESSORS: Marital or family conflict Medication change or noncompliance Substance abuse   PATIENT STRENGTHS: Ability for insight Average or above average intelligence Capable of independent living General fund of knowledge   PATIENT IDENTIFIED PROBLEMS: Substance Abuse Depression Suicidal thoughts "I wanna not feel and let her go and not be so upset about it"                     DISCHARGE CRITERIA:  Ability to meet basic life and health needs Improved stabilization in mood, thinking, and/or behavior Verbal commitment to aftercare and medication compliance Withdrawal symptoms are absent or subacute and managed without 24-hour nursing intervention  PRELIMINARY DISCHARGE PLAN: Attend aftercare/continuing care group Outpatient therapy Return to previous living arrangement  PATIENT/FAMILY INVOLVEMENT: This treatment plan has been presented to and reviewed with the patient, Dustin Owens, and/or family member, .  The patient and family have been given the opportunity to ask questions and make suggestions.  [name], [place], [place] 11/27/2016, 10:10 PM

## 2016-11-27 NOTE — ED Provider Notes (Signed)
[facility]-EMERGENCY DEPT Provider Note   CSN: [id number] Arrival date & time: 11/26/16  2320     History   Chief Complaint Chief Complaint  Patient presents with  . Suicide Attempt    HPI Dustin Owens is a 21 y.o. male.  Patient brought in by EMS after mother concerned for by his behavior. Patient reports history of depression, anxiety and bipolar disorder not on medications. He states he's been arguing with his mother and his fiance for a while. He admits to using methamphetamine injections in the daily basis. He denies any suicidal or homicidal thoughts currently. He states yesterday he took "50 pills" but does not know what they were. He states they're red pills and they were over-the-counter. He denies this was an attempt to hurt himself. EMS reports he left a suicide note the patient denies this. Patient denies when hurt anyone else at this point area states he is under a lot of stress and has issues with his family and was saying stupid things. He states he took the unknown pills around 6 AM yesterday.   The history is provided by the patient and the EMS personnel.    Past Medical History:  Diagnosis Date  . Allergy   . Anxiety   . Depression   . Headache(784.0)   . Vision abnormalities     Patient Active Problem List   Diagnosis Date Noted  . Bipolar affective disorder, depressed, severe ([facility]) 04/14/2016  . Suicide attempt by drug ingestion ([facility])   . Cocaine abuse 04/12/2016  . 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 Conduct disorder, childhood onset type 07/01/2011  . Polysubstance dependence ([facility]) 07/01/2011    Past Surgical History:  Procedure Laterality Date  . NO PAST SURGERIES         Home Medications    Prior to Admission medications   Medication Sig Start Date End Date Taking? Authorizing Provider  carbamazepine (TEGRETOL) 200 MG tablet Take 1 tablet (200 mg total) by mouth 2 (two) times daily. For mood  stabilization 04/17/16   Armandina StammerNwoko, Agnes I, NP  hydrOXYzine (ATARAX/VISTARIL) 50 MG tablet Take 1 tablet (50 mg total) by mouth every 6 (six) hours as needed for anxiety. 04/17/16   Armandina StammerNwoko, Agnes I, NP  nicotine polacrilex (NICORETTE) 2 MG gum Take 1 each (2 mg total) by mouth as needed for smoking cessation. 04/17/16   Armandina StammerNwoko, Agnes I, NP  zolpidem (AMBIEN) 5 MG tablet Take 1 tablet (5 mg total) by mouth at bedtime as needed for sleep. 04/17/16   Sanjuana KavaNwoko, Agnes I, NP    Family History Family History  Problem Relation Age of Onset  . OCD Mother   . Drug abuse Mother   . Schizophrenia Father   . Drug abuse Father     Social History Social History  Substance Use Topics  . Smoking status: Current Every Day Smoker    Packs/day: 1.00    Years: 5.00    Types: Cigarettes  . Smokeless tobacco: Never Used  . Alcohol use 0.5 oz/week    1 Standard drinks or equivalent per week     Comment: 1 - 1/2 5ths of bourbon     Allergies   Naproxen   Review of Systems Review of Systems  Constitutional: Negative for activity change, appetite change, fatigue and fever.  HENT: Negative for congestion and rhinorrhea.   Respiratory: Negative for cough, chest tightness and shortness of breath.   Cardiovascular: Negative for chest pain and  leg swelling.  Gastrointestinal: Negative for abdominal pain, nausea and vomiting.  Genitourinary: Negative for testicular pain.  Musculoskeletal: Negative for arthralgias, back pain and myalgias.  Skin: Negative for rash.  Neurological: Negative for weakness.  Psychiatric/Behavioral: Positive for agitation, behavioral problems, decreased concentration, sleep disturbance and suicidal ideas. The patient is nervous/anxious.    all other systems are negative except as noted in the HPI and PMH.     Physical Exam Updated Vital Signs BP 126/88 (BP Location: Right Arm)   Pulse 65   Temp (!) 97.5 F (36.4 C) (Oral)   Resp 16   SpO2 100%   Physical Exam  Constitutional:  He is oriented to person, place, and time. He appears well-developed and well-nourished. No distress.  Anxious appearing. Rapid and pressured speech  HENT:  Head: Normocephalic and atraumatic.  Mouth/Throat: Oropharynx is clear and moist. No oropharyngeal exudate.  Eyes: Pupils are equal, round, and reactive to light. Conjunctivae and EOM are normal.  Neck: Normal range of motion. Neck supple.  No meningismus.  Cardiovascular: Normal rate, regular rhythm, normal heart sounds and intact distal pulses.   No murmur heard. Pulmonary/Chest: Effort normal and breath sounds normal. No respiratory distress.  Abdominal: Soft. There is no tenderness. There is no rebound and no guarding.  Musculoskeletal: Normal range of motion. He exhibits no edema or tenderness.  Neurological: He is alert and oriented to person, place, and time. No cranial nerve deficit. He exhibits normal muscle tone. Coordination normal.   5/5 strength throughout. CN 2-12 intact.Equal grip strength.   Skin: Skin is warm. Capillary refill takes less than 2 seconds.  Psychiatric: He has a normal mood and affect. His behavior is normal.  Nursing note and vitals reviewed.    ED Treatments / Results  Labs (all labs ordered are listed, but only abnormal results are displayed) Labs Reviewed  COMPREHENSIVE METABOLIC PANEL - Abnormal; Notable for the following:       Result Value   CO2 20 (*)    All other components within normal limits  ETHANOL - Abnormal; Notable for the following:    Alcohol, Ethyl (B) 45 (*)    All other components within normal limits  ACETAMINOPHEN LEVEL - Abnormal; Notable for the following:    Acetaminophen (Tylenol), Serum <10 (*)    All other components within normal limits  RAPID URINE DRUG SCREEN, HOSP PERFORMED - Abnormal; Notable for the following:    Opiates POSITIVE (*)    Amphetamines POSITIVE (*)    All other components within normal limits  ACETAMINOPHEN LEVEL - Abnormal; Notable for the  following:    Acetaminophen (Tylenol), Serum <10 (*)    All other components within normal limits  CARBAMAZEPINE LEVEL, TOTAL - Abnormal; Notable for the following:    Carbamazepine Lvl <2.0 (*)    All other components within normal limits  SALICYLATE LEVEL  CBC    EKG  EKG Interpretation None       Radiology No results found.  Procedures Procedures (including critical care time)  Medications Ordered in ED Medications  nicotine (NICODERM CQ - dosed in mg/24 hours) patch 21 mg (not administered)  LORazepam (ATIVAN) tablet 1 mg (not administered)     Initial Impression / Assessment and Plan / ED Course  I have reviewed the triage vital signs and the nursing notes.  Pertinent labs & imaging results that were available during my care of the patient were reviewed by me and considered in my medical decision making (see chart  for details).     Patient presents with concern for suicidal thoughts and gesture yesterday with ingestion of unknown pills. Denies any suicidality currently. Admits to arguing with family at home.  Vitals are stable. He is in no distress. He is anxious and does not want to be here.  Drug screen was positive for opiates and amphetamines. Labs reassuring with normal anion gap. Acetaminophen level undetectable. Ingestion was almost 24 hours ago.   APAP level negative x2.  Patient is medically clear for psychiatric evaluation.  Patient meet criteria for placement. Holding orders placed. IVC paperwork completed as patient was wanting to leave.  Final Clinical Impressions(s) / ED Diagnoses   Final diagnoses:  Suicidal behavior without attempted self-injury    New Prescriptions New Prescriptions   No medications on file     Glynn Octave, MD 11/27/16 [id number]
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
## 2016-11-27 NOTE — Progress Notes (Signed)
[facility], Ferne ReusJustina Okonkwo, NP, Dr. Jama Flavorsobos is the attending provider. Patient is IVC'd and  needs transport by [employer] examinerlaw enforcement.  Patient may arrive at 8:30 Pm  Carney BernJean T. Kaylyn LimSutter, MSW, LCSWA Disposition Clinical Social Work [phone] (cell) [phone] (office)

## 2016-11-27 NOTE — Progress Notes (Signed)
Dustin Owens is a 21 year old male pt admitted on involuntary basis. On admission, Dustin Owens presents as anxious and fidgety and reports that he is coming down off a 3 day meth binge. He reports on-going issues with the mother of his children and reports he has been having on-going conflict with her for awhile now. He does endorse depression but denies any SI and is able to contract for safety while in the hospital. He reports being here earlier in the year and reports that he was having similar issues then. He reports that he will take as many drugs as he can get a hold of but reports drug of choice as being heroin. Dustin Owens reports that he lives with his aunt and reports that he will be able to go back there after discharge. He reports that he wants to be able to move on from his girlfriend and not get upset by her. He was oriented to the unit and safety maintained.

## 2016-11-28 DIAGNOSIS — T50902A Poisoning by unspecified drugs, medicaments and biological substances, intentional self-harm, initial encounter: Secondary | ICD-10-CM

## 2016-11-28 DIAGNOSIS — Z590 Homelessness: Secondary | ICD-10-CM

## 2016-11-28 DIAGNOSIS — F17210 Nicotine dependence, cigarettes, uncomplicated: Secondary | ICD-10-CM

## 2016-11-28 DIAGNOSIS — Z818 Family history of other mental and behavioral disorders: Secondary | ICD-10-CM

## 2016-11-28 DIAGNOSIS — F1020 Alcohol dependence, uncomplicated: Secondary | ICD-10-CM

## 2016-11-28 DIAGNOSIS — F1994 Other psychoactive substance use, unspecified with psychoactive substance-induced mood disorder: Secondary | ICD-10-CM

## 2016-11-28 DIAGNOSIS — Z560 Unemployment, unspecified: Secondary | ICD-10-CM

## 2016-11-28 DIAGNOSIS — F319 Bipolar disorder, unspecified: Principal | ICD-10-CM

## 2016-11-28 DIAGNOSIS — Z813 Family history of other psychoactive substance abuse and dependence: Secondary | ICD-10-CM

## 2016-11-28 DIAGNOSIS — T1491XA Suicide attempt, initial encounter: Secondary | ICD-10-CM

## 2016-11-28 MED ORDER — OLANZAPINE 5 MG PO TBDP
5.0000 mg | ORAL_TABLET | Freq: Once | ORAL | Status: DC
  Filled 2016-11-28 (×2): qty 1

## 2016-11-28 MED ORDER — HYDROXYZINE HCL 25 MG PO TABS
25.0000 mg | ORAL_TABLET | Freq: Four times a day (QID) | ORAL | Status: DC | PRN
  Administered 2016-11-29 – 2016-12-01 (×2): 25 mg via ORAL
  Filled 2016-11-28: qty 1
  Filled 2016-11-28: qty 10
  Filled 2016-11-28: qty 1

## 2016-11-28 MED ORDER — CARBAMAZEPINE 100 MG PO CHEW
200.0000 mg | CHEWABLE_TABLET | Freq: Three times a day (TID) | ORAL | Status: DC
  Administered 2016-11-28 – 2016-12-02 (×9): 200 mg via ORAL
  Filled 2016-11-28 (×16): qty 2

## 2016-11-28 MED ORDER — CLONIDINE HCL 0.1 MG PO TABS
0.1000 mg | ORAL_TABLET | Freq: Four times a day (QID) | ORAL | Status: AC
  Administered 2016-11-29 – 2016-11-30 (×5): 0.1 mg via ORAL
  Filled 2016-11-28 (×11): qty 1

## 2016-11-28 MED ORDER — LORAZEPAM 1 MG PO TABS
1.0000 mg | ORAL_TABLET | Freq: Once | ORAL | Status: DC
  Filled 2016-11-28: qty 1

## 2016-11-28 MED ORDER — ONDANSETRON 4 MG PO TBDP: 4.0000 mg | ORAL_TABLET | Freq: Four times a day (QID) | ORAL | Status: DC | PRN

## 2016-11-28 MED ORDER — CLONIDINE HCL 0.1 MG PO TABS
0.1000 mg | ORAL_TABLET | ORAL | Status: DC
  Administered 2016-12-01 – 2016-12-02 (×2): 0.1 mg via ORAL
  Filled 2016-11-28 (×4): qty 1

## 2016-11-28 MED ORDER — CLONIDINE HCL 0.1 MG PO TABS
0.1000 mg | ORAL_TABLET | Freq: Every day | ORAL | Status: DC
  Filled 2016-11-28: qty 1

## 2016-11-28 MED ORDER — METHOCARBAMOL 500 MG PO TABS
500.0000 mg | ORAL_TABLET | Freq: Three times a day (TID) | ORAL | Status: DC | PRN
  Administered 2016-11-29: 500 mg via ORAL
  Filled 2016-11-28: qty 1

## 2016-11-28 MED ORDER — HYDROXYZINE HCL 50 MG PO TABS
50.0000 mg | ORAL_TABLET | Freq: Once | ORAL | Status: DC
  Filled 2016-11-28 (×2): qty 1

## 2016-11-28 MED ORDER — DICYCLOMINE HCL 20 MG PO TABS: 20.0000 mg | ORAL_TABLET | Freq: Four times a day (QID) | ORAL | Status: DC | PRN

## 2016-11-28 MED ORDER — LOPERAMIDE HCL 2 MG PO CAPS: 2.0000 mg | ORAL_CAPSULE | ORAL | Status: DC | PRN

## 2016-11-28 NOTE — [facility] Group Notes (Signed)
LCSW Group Therapy Note  11/28/2016     10:00-11:00AM  Type of Therapy and Topic:  Group Therapy:  Decisional Balance/Substance Use  Participation Level:  Did Not Attend        . Description of Group:  The main focus of today's process group was learning how to use a decisional balance exercise to make a decision about whether to change an unhealthy coping skill, as well as how to use the information gathered in the actual process of planning that change.  Patients listed some of their most frequently utilized unhealthy coping techniques and CSW pointed out the similarities.  Motivational Interviewing and the whiteboard were utilized to help patients explore in-depth the perceived benefits and costs of a specific, shared unhealthy coping technique (drinking & drugging) as well as the benefits and costs of replacing that with other, healthy coping skills.  A handout was distributed for patients to be able to do this exercise for themselves.     Therapeutic Goals 1. Patient will be able to utilize the decision balance exercise on their own 2. Patient will list coping skills they use to fulfill their needs 3. Patient will identify the differences between healthy and  unhealthy coping skills 4. Patient will verbalize the costs and benefits of drinking/drugging versus making the choice to change 5. Patient will learn how to use the exercise to identify the most important supports to put in place so that they can succeed in a change to which they commit  Summary of Patient Progress: N/A   Therapeutic Modalities Cognitive Behavioral Therapy Motivational Interviewing   Lynnell ChadMareida J Grossman-Orr, LCSW 11/28/2016 12:49 PM

## 2016-11-28 NOTE — H&P (Signed)
Psychiatric Admission Assessment Adult  Patient Identification: Dustin Owens MRN:  [id number] Date of Evaluation:  11/28/2016 Chief Complaint:  Bipolar disorder  Amphetamine use disorder Ethoh use disorder Mod substance induced mood disorder  Principal Diagnosis: Bipolar I Disorder                                         SUD Diagnosis:   Patient Active Problem List   Diagnosis Date Noted  . Bipolar 1 disorder ([place]) [F31.9] 11/27/2016  . Bipolar affective disorder, depressed, severe ([place]) [F31.4] 04/14/2016  . Suicide attempt by drug ingestion ([place]) [T50.902A]   . Cocaine abuse [F14.10] 04/12/2016  . Severe recurrent major depression with psychotic features ([place]) [F33.3] 07/01/2011  . 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 Polysubstance dependence ([place]) [F19.20] 07/01/2011   History of Present Illness:  21 y.o  Caucasian male, single, homeless, unemplyed. Background history of Bipolar Disorder and SUD. Presented to the ER via EMS. Girlfriend called for help as patient overdosed on fifty pills. Reported to have left a suicide note. Expressed hopelessness and worthlessness. Has been on a drug binge for over three weeks. Daily IVDU. Positive for opiates and amphetamine. BAL 45 mg/dl. Other lab results are essentially normal.   At interview, patient states that his ex-girlfriend made her lose her job. Says she told her employers to carry out a drug test. Says he was positive and got fired. He has been off work for four months. Says he sold his things to scrap by. Says his mom and aunt has been supportive. Patient says he had given up on life. He has been using drugs on a daily basis. Injects amphetamine , heroin and drinks alcohol occasionally. " I have been ripping my own life apart ,,,, nothing more to live for ,,,,, I had a wonderful job ,,,, my license has been taken away ,,,, I have not been able to see my kids ,,,, I was trying to let her know how much she has fucked  up my life" Says he was at his mom's place when he took the OD. Not sure what medication he took. Says he was on Facetime with his girlfriend. She saw what he did and alerted his mom. Says he does not care anymore. Feels he has nothing to live for. Says he has met his lifetime expectations already. Tells me that suicide has been in his conscious mind for the past couple of weeks. Feels all we can do is delay it by keeping him here. Says he does not care anymore. He quit taking his medications because he could not afford it. Describes hallucinations" wavy traces ,,,, like I am tripping ,,,, nothing bad". Denies any auditory hallucinations. No tactile hallucinations. Denies any thoughts of homicide. Denies any thoughts of violence. Sleep wake cycle has been disrupted with substance use. He has not been eating well. No racing thoughts but describes marked irritability. No legal issues lately. No other stressors.  Total Time spent with patient: 1 hour  Past Psychiatric History:  Bipolar Disorder and SUD. This is his third hospitalization here. He was last stabilized on Tegretol. Last admission was in context of suicidal behavior. Did not follow up post discharge.  Is the patient at risk to self? Yes.    Has the patient been a risk to self in the past 6 months? Yes.    Has the  patient been a risk to self within the distant past? Yes.    Is the patient a risk to others? No.  Has the patient been a risk to others in the past 6 months? No.  Has the patient been a risk to others within the distant past? No.   Prior Inpatient Therapy:   Prior Outpatient Therapy:    Alcohol Screening: 1. How often do you have a drink containing alcohol?: 2 to 4 times a month 2. How many drinks containing alcohol do you have on a typical day when you are drinking?: 1 or 2 3. How often do you have six or more drinks on one occasion?: Never Preliminary Score: 0 9. Have you or someone else been injured as a result of your  drinking?: No 10. Has a relative or friend or a doctor or another health worker been concerned about your drinking or suggested you cut down?: No Alcohol Use Disorder Identification Test Final Score (AUDIT): 2 Brief Intervention: AUDIT score less than 7 or less-screening does not suggest unhealthy drinking-brief intervention not indicated Substance Abuse History in the last 12 months:  Yes.   Consequences of Substance Abuse:  As above  Previous Psychotropic Medications: Yes  Psychological Evaluations: Yes  Past Medical History:  Past Medical History:  Diagnosis Date  . Allergy   . Anxiety   . Depression   . Headache(784.0)   . Vision abnormalities     Past Surgical History:  Procedure Laterality Date  . NO PAST SURGERIES     Family History:  Family History  Problem Relation Age of Onset  . OCD Mother   . Drug abuse Mother   . Schizophrenia Father   . Drug abuse Father    Family Psychiatric  History: See old notes Tobacco Screening: Have you used any form of tobacco in the last 30 days? (Cigarettes, Smokeless Tobacco, Cigars, and/or Pipes): Yes Tobacco use, Select all that apply: 5 or more cigarettes per day Are you interested in Tobacco Cessation Medications?: Yes, will notify MD for an order Counseled patient on smoking cessation including recognizing danger situations, developing coping skills and basic information about quitting provided: Refused/Declined practical counseling Social History:  History  Alcohol Use  . 0.5 oz/week  . 1 Standard drinks or equivalent per week    Comment: 1 - 1/2 5ths of bourbon     History  Drug Use  . Frequency: 7.0 times per week  . Types: Amphetamines, Benzodiazepines, Cocaine, Codeine, Fentanyl, Hashish, Heroin, Hydrocodone, Hydromorphone, LSD, Marijuana, MDMA (Ecstacy), Methamphetamines, Morphine, Nitrous oxide, Opium, PCP, Solvent inhalants    Additional Social History:   Allergies:   Allergies  Allergen Reactions  . Naproxen  Nausea And Vomiting  . Percocet [Oxycodone-Acetaminophen] Other (See Comments)    Acid reflux   Lab Results:  Results for orders placed or performed during the hospital encounter of 11/27/16 (from the past 48 hour(s))  Comprehensive metabolic panel     Status: Abnormal   Collection Time: 11/26/16 11:23 PM  Result Value Ref Range   Sodium 137 135 - 145 mmol/L   Potassium 3.8 3.5 - 5.1 mmol/L   Chloride 103 101 - 111 mmol/L   CO2 20 (L) 22 - 32 mmol/L   Glucose, Bld 82 65 - 99 mg/dL   BUN 13 6 - 20 mg/dL   Creatinine, Ser 1.07 0.61 - 1.24 mg/dL   Calcium 9.6 8.9 - 10.3 mg/dL   Total Protein 7.5 6.5 - 8.1 g/dL   Albumin  4.5 3.5 - 5.0 g/dL   AST 26 15 - 41 U/L   ALT 26 17 - 63 U/L   Alkaline Phosphatase 98 38 - 126 U/L   Total Bilirubin 0.8 0.3 - 1.2 mg/dL   GFR calc non Af Amer >60 >60 mL/min   GFR calc Af Amer >60 >60 mL/min    Comment: (NOTE) The eGFR has been calculated using the CKD EPI equation. This calculation has not been validated in all clinical situations. eGFR's persistently <60 mL/min signify possible Chronic Kidney Disease.    Anion gap 14 5 - 15  Ethanol     Status: Abnormal   Collection Time: 11/26/16 11:23 PM  Result Value Ref Range   Alcohol, Ethyl (B) 45 (H) <5 mg/dL    Comment:        LOWEST DETECTABLE LIMIT FOR SERUM ALCOHOL IS 5 mg/dL FOR MEDICAL PURPOSES ONLY   Salicylate level     Status: None   Collection Time: 11/26/16 11:23 PM  Result Value Ref Range   Salicylate Lvl <0.0 2.8 - 30.0 mg/dL  Acetaminophen level     Status: Abnormal   Collection Time: 11/26/16 11:23 PM  Result Value Ref Range   Acetaminophen (Tylenol), Serum <10 (L) 10 - 30 ug/mL    Comment:        THERAPEUTIC CONCENTRATIONS VARY SIGNIFICANTLY. A RANGE OF 10-30 ug/mL MAY BE AN EFFECTIVE CONCENTRATION FOR MANY PATIENTS. HOWEVER, SOME ARE BEST TREATED AT CONCENTRATIONS OUTSIDE THIS RANGE. ACETAMINOPHEN CONCENTRATIONS >150 ug/mL AT 4 HOURS AFTER INGESTION AND >50 ug/mL AT  12 HOURS AFTER INGESTION ARE OFTEN ASSOCIATED WITH TOXIC REACTIONS.   cbc     Status: None   Collection Time: 11/26/16 11:23 PM  Result Value Ref Range   WBC 5.3 4.0 - 10.5 K/uL   RBC 4.60 4.22 - 5.81 MIL/uL   Hemoglobin 13.8 13.0 - 17.0 g/dL   HCT 39.7 39.0 - 52.0 %   MCV 86.3 78.0 - 100.0 fL   MCH 30.0 26.0 - 34.0 pg   MCHC 34.8 30.0 - 36.0 g/dL   RDW 13.2 11.5 - 15.5 %   Platelets 262 150 - 400 K/uL  Rapid urine drug screen (hospital performed)     Status: Abnormal   Collection Time: 11/26/16 11:30 PM  Result Value Ref Range   Opiates POSITIVE (A) NONE DETECTED   Cocaine NONE DETECTED NONE DETECTED   Benzodiazepines NONE DETECTED NONE DETECTED   Amphetamines POSITIVE (A) NONE DETECTED   Tetrahydrocannabinol NONE DETECTED NONE DETECTED   Barbiturates NONE DETECTED NONE DETECTED    Comment:        DRUG SCREEN FOR MEDICAL PURPOSES ONLY.  IF CONFIRMATION IS NEEDED FOR ANY PURPOSE, NOTIFY LAB WITHIN 5 DAYS.        LOWEST DETECTABLE LIMITS FOR URINE DRUG SCREEN Drug Class       Cutoff (ng/mL) Amphetamine      1000 Barbiturate      200 Benzodiazepine   938 Tricyclics       182 Opiates          300 Cocaine          300 THC              50   Acetaminophen level     Status: Abnormal   Collection Time: 11/27/16  3:05 AM  Result Value Ref Range   Acetaminophen (Tylenol), Serum <10 (L) 10 - 30 ug/mL    Comment:  THERAPEUTIC CONCENTRATIONS VARY SIGNIFICANTLY. A RANGE OF 10-30 ug/mL MAY BE AN EFFECTIVE CONCENTRATION FOR MANY PATIENTS. HOWEVER, SOME ARE BEST TREATED AT CONCENTRATIONS OUTSIDE THIS RANGE. ACETAMINOPHEN CONCENTRATIONS >150 ug/mL AT 4 HOURS AFTER INGESTION AND >50 ug/mL AT 12 HOURS AFTER INGESTION ARE OFTEN ASSOCIATED WITH TOXIC REACTIONS.   Carbamazepine level, total     Status: Abnormal   Collection Time: 11/27/16  3:05 AM  Result Value Ref Range   Carbamazepine Lvl <2.0 (L) 4.0 - 12.0 ug/mL    Comment: REPEATED TO VERIFY    Blood Alcohol  level:  Lab Results  Component Value Date   ETH 45 (H) 11/26/2016   ETH <5 16/12/9602    Metabolic Disorder Labs:  No results found for: HGBA1C, MPG No results found for: PROLACTIN No results found for: CHOL, TRIG, HDL, CHOLHDL, VLDL, LDLCALC  Current Medications: Current Facility-Administered Medications  Medication Dose Route Frequency Provider Last Rate Last Dose  . acetaminophen (TYLENOL) tablet 650 mg  650 mg Oral Q6H PRN Derrill Center, NP      . alum & mag hydroxide-simeth (MAALOX/MYLANTA) 200-200-20 MG/5ML suspension 30 mL  30 mL Oral Q4H PRN Derrill Center, NP      . feeding supplement (ENSURE ENLIVE) (ENSURE ENLIVE) liquid 237 mL  237 mL Oral BID BM Cobos, Myer Peer, MD      . hydrOXYzine (VISTARIL) injection 25 mg  25 mg Intramuscular Q6H PRN Derrill Center, NP      . LORazepam (ATIVAN) tablet 1 mg  1 mg Oral Q6H PRN Lindon Romp A, NP      . magnesium hydroxide (MILK OF MAGNESIA) suspension 30 mL  30 mL Oral Daily PRN Derrill Center, NP      . nicotine (NICODERM CQ - dosed in mg/24 hours) patch 21 mg  21 mg Transdermal Daily Cobos, Myer Peer, MD      . traZODone (DESYREL) tablet 50 mg  50 mg Oral QHS PRN Derrill Center, NP       PTA Medications: Prescriptions Prior to Admission  Medication Sig Dispense Refill Last Dose  . carbamazepine (TEGRETOL) 200 MG tablet Take 1 tablet (200 mg total) by mouth 2 (two) times daily. For mood stabilization (Patient not taking: Reported on 11/27/2016) 60 tablet 0 Not Taking at Unknown time  . diphenhydrAMINE (BENADRYL) 25 mg capsule Take 25 mg by mouth every 6 (six) hours as needed (for allergies or itching).   PRN at PRN  . hydrOXYzine (ATARAX/VISTARIL) 50 MG tablet Take 1 tablet (50 mg total) by mouth every 6 (six) hours as needed for anxiety. (Patient not taking: Reported on 11/27/2016) 60 tablet 0 Not Taking at Unknown time  . ibuprofen (ADVIL,MOTRIN) 200 MG tablet Take 200-400 mg by mouth every 6 (six) hours as needed (for pain or  headaches).   PRN at PRN  . nicotine polacrilex (NICORETTE) 2 MG gum Take 1 each (2 mg total) by mouth as needed for smoking cessation. (Patient not taking: Reported on 11/27/2016) 100 tablet 0 Not Taking at Unknown time  . zolpidem (AMBIEN) 5 MG tablet Take 1 tablet (5 mg total) by mouth at bedtime as needed for sleep. (Patient not taking: Reported on 11/27/2016) 7 tablet 0 Not Taking at Unknown time    Musculoskeletal: Strength & Muscle Tone: within normal limits Gait & Station: normal Patient leans: N/A  Psychiatric Specialty Exam: Physical Exam  Constitutional: He appears well-developed and well-nourished.  HENT:  Head: Normocephalic and atraumatic.  Respiratory: Effort normal.  Neurological: He is alert.  Skin: Skin is warm and dry.  Psychiatric:  As above    ROS  Blood pressure 115/68, pulse 69, temperature 98.3 F (36.8 C), temperature source Oral, resp. rate 16, height 6' (1.829 m), weight 60.3 kg (133 lb).Body mass index is 18.04 kg/m.  General Appearance:  Casually dressed, was asleep just prior to interview. Moderate engagement. Tired looking.   Eye Contact:  Poor  Speech:  Slurred  Volume:  Decreased  Mood:  Dysphoric, Hopeless, Irritable and Worthless  Affect:  Blunted.   Thought Process:  Linear  Orientation:  Full (Time, Place, and Person)  Thought Content:  Rumination  Suicidal Thoughts:  Yes.  with intent/plan  Homicidal Thoughts:  No  Memory:  Unable to assess at this time.   Judgement:  Poor  Insight:  Poor  Psychomotor Activity:  Decreased  Concentration:  Impaired  Recall: Unable to assess at this time.   Fund of Knowledge:  Fair  Language:  Fair  Akathisia:  Negative  Handed:    AIMS (if indicated):     Assets:  Physical Health  ADL's:  Fair  Cognition:  Impaired,  Mild  Sleep:       Treatment Plan Summary: Patient is intoxicated with multiple psychoactive substances. He has been depressed since he lost his job. Maladaptive coping with drugs  and alcohol. He has been having worsening suicidal thoughts. Recent attempt was serious. Patient continues to express hopelessness and worthlessness. He is at high risk of suicide. He was passive during discussion on treatment. He agreed to get back on Tegretol. Would continue IVC  Psychiatric: Bipolar disorder ,,,, current episode is depression SUD SIMD  Medical:  Psychosocial:  Job loss Loss of access to his kids Homelessness  PLAN: 1. Alcohol withdrawal protocol 2. Symptomatic opiate withdrawal protocol 3. Carbamazepine 200 mg TID 4. Encourage unit groups and activities 5. Monitor mood, behavior and interaction with peers 6. Motivational enhancement  7. SW would obtain collateral from his family   Observation Level/Precautions:  Detox 15 minute checks  Laboratory:    Psychotherapy:    Medications:    Consultations:    Discharge Concerns:    Estimated LOS:  Other:     Physician Treatment Plan for Primary Diagnosis: <principal problem not specified> Long Term Goal(s): Improvement in symptoms so as ready for discharge  Short Term Goals: Ability to identify changes in lifestyle to reduce recurrence of condition will improve, Ability to verbalize feelings will improve, Ability to disclose and discuss suicidal ideas, Ability to demonstrate self-control will improve, Ability to identify and develop effective coping behaviors will improve, Ability to maintain clinical measurements within normal limits will improve, Compliance with prescribed medications will improve and Ability to identify triggers associated with substance abuse/mental health issues will improve  Physician Treatment Plan for Secondary Diagnosis: Active Problems:   Bipolar 1 disorder ([place])  Long Term Goal(s): Improvement in symptoms so as ready for discharge  Short Term Goals: Ability to identify changes in lifestyle to reduce recurrence of condition will improve, Ability to verbalize feelings will improve,  Ability to disclose and discuss suicidal ideas, Ability to demonstrate self-control will improve, Ability to identify and develop effective coping behaviors will improve, Ability to maintain clinical measurements within normal limits will improve, Compliance with prescribed medications will improve and Ability to identify triggers associated with substance abuse/mental health issues will improve  I certify that inpatient services furnished can reasonably be expected to improve the patient's condition.  [place], MD 9/1/201810:47 AM
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
## 2016-11-28 NOTE — [facility] Counselor (Signed)
Two attempts were made today to do Psychosocial Assessment with patient, but he was asleep and could not be awakened.  He was seen in between these times in the hall, and expressed reluctance to do Psychosocial Assessment today, stating he wished he wasn't alive to have to deal with any of this.  Ambrose MantleMareida Grossman-Orr, LCSW 11/28/2016, 2:19 PM

## 2016-11-28 NOTE — Progress Notes (Signed)
dult Psychoeducational Group Note  Date:  11/28/2016 Time:  1300  Group Topic/Focus:  Identifying Needs:   The focus of this group is to help patients identify their personal needs that have been historically problematic and identify healthy behaviors to address their needs.  Participation Level:  Did Not Attend   

## 2016-11-28 NOTE — [facility] Suicide Risk Assessment (Signed)
[facility]   Nursing information obtained from:    Demographic factors:    Current Mental Status:    Loss Factors:    Historical Factors:    Risk Reduction Factors:     Total Time spent with patient: 30 minutes Principal Problem: <principal problem not specified> Diagnosis:   Patient Active Problem List   Diagnosis Date Noted  . Bipolar 1 disorder (HCC) [F31.9] 11/27/2016  . Bipolar affective disorder, depressed, severe (HCC) [F31.4] 04/14/2016  . Suicide attempt by drug ingestion (HCC) [T50.902A]   . Cocaine abuse [F14.10] 04/12/2016  . Severe recurrent major depression with psychotic features (HCC) [F33.3] 07/01/2011  . 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 Polysubstance dependence [facility](HCC) [F19.20] 07/01/2011   Subjective Data:  21 y.o  Caucasian male, single, homeless, unemplyed. Background history of Bipolar Disorder and SUD. Presented to the ER via EMS. Girlfriend called for help as patient overdosed on fifty pills. Reported to have left a suicide note. Expressed hopelessness and worthlessness. Has been on a drug binge for over three weeks. Daily IVDU. Positive for opiates and amphetamine. BAL 45 mg/dl. Other lab results are essentially normal. Patient is depressed. He feels hopeless and worthless. He has lost his job, housing, relationship and access to his kids. He has been using drugs daily. Very poor judgement. Abnormal perception while under the influence. He continues to express suicidal thoughts. He has agreed to restarting his mood stabilizer.   Continued Clinical Symptoms:  Alcohol Use Disorder Identification Test Final Score (AUDIT): 2 The "Alcohol Use Disorders Identification Test", Guidelines for Use in Primary Care, Second Edition.  World Science writerHealth Organization [facility](WHO). Score between 0-7:  no or low risk or alcohol related problems. Score between 8-15:  moderate risk of alcohol related problems. Score between 16-19:  high  risk of alcohol related problems. Score 20 or above:  warrants further diagnostic evaluation for alcohol dependence and treatment.   CLINICAL FACTORS:   Bipolar Disorder:   Depressive phase Alcohol/Substance Abuse/Dependencies   Musculoskeletal: Strength & Muscle Tone: within normal limits Gait & Station: normal Patient leans: N/A  Psychiatric Specialty Exam: Physical Exam  ROS  Blood pressure 115/68, pulse 69, temperature 98.3 F (36.8 C), temperature source Oral, resp. rate 16, height 6' (1.829 m), weight 60.3 kg (133 lb).Body mass index is 18.04 kg/m.  General Appearance: As in H&P  Eye Contact:  As in H&P  Speech:  As in H&P  Volume:  As in H&P  Mood:  As in H&P  Affect:  As in H&P  Thought Process:  As in H&P  Orientation:  As in H&P  Thought Content:  As in H&P  Suicidal Thoughts:  As in H&P  Homicidal Thoughts:  As in H&P  Memory:  As in H&P  Judgement:  As in H&P  Insight:  As in H&P  Psychomotor Activity:  As in H&P  Concentration:  As in H&P  Recall:  As in H&P  Fund of Knowledge:  As in H&P  Language:  As in H&P  Akathisia:  As in H&P  Handed:    AIMS (if indicated):     Assets:  As in H&P  ADL's:  As in H&P  Cognition:  As in H&P  Sleep:         COGNITIVE FEATURES THAT CONTRIBUTE TO RISK:  Closed-mindedness and Loss of executive function    SUICIDE RISK:   Severe:  Frequent, intense, and enduring suicidal ideation, specific plan, no  subjective intent, but some objective markers of intent (i.e., choice of lethal method), the method is accessible, some limited preparatory behavior, evidence of impaired self-control, severe dysphoria/symptomatology, multiple risk factors present, and few if any protective factors, particularly a lack of social support.  PLAN OF CARE:  As in H&P  I certify that inpatient services furnished can reasonably be expected to improve the patient's condition.   Georgiann Cocker, MD 11/28/2016, 11:33 AM

## 2016-11-28 NOTE — Progress Notes (Addendum)
Data. Patient denies SI/HI/AVH. Verbally contracts for safety on the unit and to come to staff before acting of any self harm thoughts/feelings/voices. Patient has been isolating in his room most of the day. He has been irritable through out shift and has had episodes of tearfullness on and off. He reported this AM, "I want to beat the shit out of the guy with my girlfriend right now. I want to make him look like he fell down three flights of stairs." Nurse educated patient of the consequences of this and patient replied, "I just really hate them both right now." Patient also reported significant withdrawal symptoms. BP has been low. Fluids have been pushed. Patient denies lightheadedness or other symptoms. Action. Emotional support and encouragement offered. Education provided on medication, indications and side effect. Q 15 minute checks done for safety. Response. Safety on the unit maintained through 15 minute checks.  Medications taken as prescribed.   Patient had a phone call with his ex-girlfriend about 6pm. After he got off the phone he was very agitated and walked hastily to his room. Nurses gave him a prn for anxiety. Patient disclosed that he has had a thought for most of his life that when he became 21 YO he would kill himself. He also talked about how he called his XGF to ask her to bring the kids to visit him and she told him that she was gong to be, "Putting a restraining order on me! [facility] I [facility] in the [facility]." Patient was very upset, crying and punching hs mattress.  Patient also talked about how he has lost multiple people in his life, friends, a GF, and uncle and how, "This depression is deep in my family and I will never be able to get rid of it." Patient also discolsed, "I constantly hyave multiple people telling me that the only way to be happy is to kill myself." He says that these people are people only he can see and hear. Patient also talked about, "These things come  around. They mostly come at night, but sometimes during the day. They surround me. They tell me that they are going to take me with them. I don't want to go." Patient stated that tese, "Things", terrify him and that they tell him that if he tells anyone about them, "They will be mad and they will kill me."  Patient was able to be calmed down, reassured and redirected. On coming nurse informed.

## 2016-11-28 NOTE — Progress Notes (Signed)
D.  Pt has remained in room most of shift, did come out to attempt to make a phone call at one time.  Pt did not feel well enough to attend evening wrap up group.  Pt reports passive SI but contracts for safety on the unit.  Pt reported to day shift that he was hearing people that are not there.  Pt has been tearful for most of day and pacing.  A.  Contacted NP and received orders for new medications, Pt appeared asleep when attempted to give these.  Respirations even and unlabored and no distress noted.  R.  Will continue to monitor and will medicate Pt appropriately if he does wake up this evening.  Pt remains safe on the unit.

## 2016-11-29 MED ORDER — OLANZAPINE 5 MG PO TBDP
5.0000 mg | ORAL_TABLET | Freq: Every day | ORAL | Status: DC
  Administered 2016-11-29 – 2016-12-01 (×3): 5 mg via ORAL
  Filled 2016-11-29 (×2): qty 1
  Filled 2016-11-29: qty 7
  Filled 2016-11-29 (×2): qty 1
  Filled 2016-11-29: qty 7
  Filled 2016-11-29: qty 1

## 2016-11-29 NOTE — [facility] Group Notes (Addendum)
[facility] Group Notes:  (Nursing/MHT/Case Management/Adjunct)  Date:  11/29/2016  Time:  3:15 PM  Type of Therapy:  Nurse Education  Participation Level:  Did Not Attend   Summary of Progress/Problems: Group education discussing coping skills and how to change negative thinking to more positive thoughts. Also discussed the interconnectedness of automatic thoughts, feelings and actions.  Dustin Owens 11/29/2016, 3:15 PM

## 2016-11-29 NOTE — Progress Notes (Signed)
[facility]  11/29/2016 12:57 PM Dustin Owens  MRN:  [id number] Subjective:   21 y.o  Caucasian male, single, homeless, unemplyed. Background history of Bipolar Disorder and SUD. Presented to the ER via EMS. Girlfriend called for help as patient overdosed on fifty pills. Reported to have left a suicide note. Expressed hopelessness and worthlessness. Has been on a drug binge for over three weeks. Daily IVDU. Positive for opiates and amphetamine. BAL 45 mg/dl. Other lab results are essentially normal.   Chart reviewed today. Patient discussed at team team  Staff reports that he was agitated yesterday. He was responding to internal stimuli. He reported seeing people who were giving him instructions to kill himself. He required PRN Olanzapine to calm down. He has been isolating self. Limited engagement with the milieu. Vitals has been stable.  Seen today, in bed. Says he is tired and just wants to sleep. Says he is not hearing things now. Not seeing things that are not there at this time. Upset that his mother committed him. Wants to know how long he needs to be here. Dismissive about suicidal thoughts. Denies any thoughts of violence. He has agreed to scheduled Olanzapine at bedtime.  Principal Problem: Bipolar 1 disorder (HCC) Diagnosis:   Patient Active Problem List   Diagnosis Date Noted  . Substance induced mood disorder (HCC) [F19.94] 11/28/2016  . Bipolar 1 disorder (HCC) [F31.9] 11/27/2016  . Bipolar affective disorder, depressed, severe (HCC) [F31.4] 04/14/2016  . Suicide attempt by drug ingestion (HCC) [T50.902A]   . Cocaine abuse [F14.10] 04/12/2016  . Severe recurrent major depression with psychotic features (HCC) [F33.3] 07/01/2011  . 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 Polysubstance dependence (HCC) [F19.20] 07/01/2011   Total Time spent with patient: 20 minutes  Past Psychiatric History: As in H&P  Past Medical History:  Past Medical History:   Diagnosis Date  . Allergy   . Anxiety   . Depression   . Headache(784.0)   . Vision abnormalities     Past Surgical History:  Procedure Laterality Date  . NO PAST SURGERIES     Family History:  Family History  Problem Relation Age of Onset  . OCD Mother   . Drug abuse Mother   . Schizophrenia Father   . Drug abuse Father    Family Psychiatric  History: As in H&P Social History:  History  Alcohol Use  . 0.5 oz/week  . 1 Standard drinks or equivalent per week    Comment: 1 - 1/2 5ths of bourbon     History  Drug Use  . Frequency: 7.0 times per week  . Types: Amphetamines, Benzodiazepines, Cocaine, Codeine, Fentanyl, Hashish, Heroin, Hydrocodone, Hydromorphone, LSD, Marijuana, MDMA (Ecstacy), Methamphetamines, Morphine, Nitrous oxide, Opium, PCP, Solvent inhalants    Social History   Social History  . Marital status: Single    Spouse name: N/A  . Number of children: N/A  . Years of education: N/A   Occupational History  . student     11th grade at a [employer] facility   Social History Main Topics  . Smoking status: Current Every Day Smoker    Packs/day: 1.00    Years: 5.00    Types: Cigarettes  . Smokeless tobacco: Never Used  . Alcohol use 0.5 oz/week    1 Standard drinks or equivalent per week     Comment: 1 - 1/2 5ths of bourbon  . Drug use: Yes    Frequency: 7.0 times per week  Types: Amphetamines, Benzodiazepines, Cocaine, Codeine, Fentanyl, Hashish, Heroin, Hydrocodone, Hydromorphone, LSD, Marijuana, MDMA (Ecstacy), Methamphetamines, Morphine, Nitrous oxide, Opium, PCP, Solvent inhalants  . Sexual activity: Yes    Partners: Female    Birth control/ protection: Condom   Other Topics Concern  . None   Social History Narrative  . None   Additional Social History:      Sleep: Fair  Appetite:  Fair  Current Medications: Current Facility-Administered Medications  Medication Dose Route Frequency Provider Last Rate Last Dose  . acetaminophen  (TYLENOL) tablet 650 mg  650 mg Oral Q6H PRN Oneta Rack, NP   650 mg at 11/28/16 1722  . alum & mag hydroxide-simeth (MAALOX/MYLANTA) 200-200-20 MG/5ML suspension 30 mL  30 mL Oral Q4H PRN Oneta Rack, NP      . carbamazepine (TEGRETOL) chewable tablet 200 mg  200 mg Oral TID Georgiann Cocker, MD   200 mg at 11/29/16 1200  . cloNIDine (CATAPRES) tablet 0.1 mg  0.1 mg Oral QID Nira Conn A, NP   0.1 mg at 11/29/16 1200   Followed by  . [START ON 12/01/2016] cloNIDine (CATAPRES) tablet 0.1 mg  0.1 mg Oral BH-qamhs Jackelyn Poling, NP       Followed by  . [START ON 12/04/2016] cloNIDine (CATAPRES) tablet 0.1 mg  0.1 mg Oral QAC breakfast Nira Conn A, NP      . dicyclomine (BENTYL) tablet 20 mg  20 mg Oral Q6H PRN Nira Conn A, NP      . feeding supplement (ENSURE ENLIVE) (ENSURE ENLIVE) liquid 237 mL  237 mL Oral BID BM Cobos, Rockey Situ, MD   237 mL at 11/29/16 1204  . hydrOXYzine (ATARAX/VISTARIL) tablet 25 mg  25 mg Oral Q6H PRN Nira Conn A, NP   25 mg at 11/29/16 1207  . OLANZapine zydis (ZYPREXA) disintegrating tablet 5 mg  5 mg Oral Once Jackelyn Poling, NP       Followed by  . LORazepam (ATIVAN) tablet 1 mg  1 mg Oral Once Nira Conn A, NP       Followed by  . hydrOXYzine (ATARAX/VISTARIL) tablet 50 mg  50 mg Oral Once Nira Conn A, NP      . loperamide (IMODIUM) capsule 2-4 mg  2-4 mg Oral PRN Nira Conn A, NP      . LORazepam (ATIVAN) tablet 1 mg  1 mg Oral Q6H PRN Nira Conn A, NP   1 mg at 11/28/16 1844  . magnesium hydroxide (MILK OF MAGNESIA) suspension 30 mL  30 mL Oral Daily PRN Oneta Rack, NP      . methocarbamol (ROBAXIN) tablet 500 mg  500 mg Oral Q8H PRN Nira Conn A, NP   500 mg at 11/29/16 1207  . nicotine (NICODERM CQ - dosed in mg/24 hours) patch 21 mg  21 mg Transdermal Daily Cobos, Rockey Situ, MD   21 mg at 11/29/16 1211  . ondansetron (ZOFRAN-ODT) disintegrating tablet 4 mg  4 mg Oral Q6H PRN Nira Conn A, NP      . traZODone (DESYREL)  tablet 50 mg  50 mg Oral QHS PRN Oneta Rack, NP        Lab Results: No results found for this or any previous visit (from the past 48 hour(s)).  Blood Alcohol level:  Lab Results  Component Value Date   ETH 45 (H) 11/26/2016   ETH <5 04/11/2016    Metabolic Disorder Labs: No results found for: HGBA1C,  MPG No results found for: PROLACTIN No results found for: CHOL, TRIG, HDL, CHOLHDL, VLDL, LDLCALC  Physical Findings: AIMS: Facial and Oral Movements Muscles of Facial Expression: None, normal Lips and Perioral Area: None, normal Jaw: None, normal Tongue: None, normal,Extremity Movements Upper (arms, wrists, hands, fingers): None, normal Lower (legs, knees, ankles, toes): None, normal, Trunk Movements Neck, shoulders, hips: None, normal, Overall Severity Severity of abnormal movements (highest score from questions above): None, normal Incapacitation due to abnormal movements: None, normal Patient's awareness of abnormal movements (rate only patient's report): No Awareness, Dental Status Current problems with teeth and/or dentures?: No Does patient usually wear dentures?: No  CIWA:  CIWA-Ar Total: 8 COWS:  COWS Total Score: 7  Musculoskeletal: Strength & Muscle Tone: within normal limits Gait & Station: normal Patient leans: N/A  Psychiatric Specialty Exam: Physical Exam  Constitutional: He appears distressed.  Respiratory: Effort normal.  Psychiatric:  As above    ROS  Blood pressure 118/74, pulse 86, temperature 98.3 F (36.8 C), temperature source Oral, resp. rate 16, height 6' (1.829 m), weight 60.3 kg (133 lb).Body mass index is 18.04 kg/m.  General Appearance:  Poor personal hygiene. Has clothing's on the floor. Irritable. Limited engagement.    Eye Contact:  Minimal  Speech:  Garbled  Volume:  Normal  Mood:  Dysphoric and Irritable  Affect:  Blunted   Thought Process:  Linear  Orientation:  Unable to assess at this time.   Thought Content:  Unable to  fully explore at this time  Suicidal Thoughts:  Still there though he is minimizing it  Homicidal Thoughts:  No  Memory:  Unable to assess at this time.   Judgement:  Poor  Insight:  Shallow  Psychomotor Activity:  Restlessness  Concentration:  Poor  Recall:  Unable to assess at this time.   Fund of Knowledge:  Fair  Language:  Good  Akathisia:  Negative  Handed:    AIMS (if indicated):     Assets:  Physical Health  ADL's:  Impaired  Cognition:  Impaired,  Mild  Sleep:  Number of Hours: 6.5     Treatment Plan Summary:  Patient is withdrawing from psychoactive substances. He still has psychotic trips. He is very irritable and not able to function. We have agreed to add Olanzapine at bedtime. Needs further inpatient stabilization.   Psychiatric: Bipolar disorder ,,,, current episode is depression SUD SIMD  Medical:  Psychosocial:  Job loss Loss of access to his kids Homelessness  PLAN: 1. Olanzapine 5 mg HS 2. Continue other medications at current dose 3. Continue to monitor mood, behavior and interaction with peers 4. Continue to encourage unit groups and activities  Georgiann Cocker, MD 11/29/2016, 12:57 PM

## 2016-11-29 NOTE — Progress Notes (Signed)
NUTRITION ASSESSMENT  Pt identified as at risk on the Malnutrition Screen Tool  INTERVENTION: 1. Supplements: continue Ensure Enlive po BID, each supplement provides 350 kcal and 20 grams of protein  NUTRITION DIAGNOSIS: Unintentional weight loss related to sub-optimal intake as evidenced by pt report.   Goal: Pt to meet >/= 90% of their estimated nutrition needs.  Monitor:  PO intake  Assessment:  Pt admitted with bipolar disorder and polysubstance abuse. Pt recovering from a  3 day meth binge. Pt ate 100% in ED. Pt with minimal weight loss since January. Pt has been ordered ensure supplements, will continue given underweight status and substance abuse.   Height: Ht Readings from Last 1 Encounters:  11/27/16 6' (1.829 m)    Weight: Wt Readings from Last 1 Encounters:  11/27/16 133 lb (60.3 kg)    Weight Hx: Wt Readings from Last 10 Encounters:  11/27/16 133 lb (60.3 kg)  04/13/16 139 lb (63 kg)  04/11/16 136 lb (61.7 kg)  07/04/11 136 lb 11 oz (62 kg) (46 %, Z= -0.10)*  06/30/11 111 lb (50.3 kg) (7 %, Z= -1.47)*   * Growth percentiles are based on CDC 2-20 Years data.    BMI:  Body mass index is 18.04 kg/m. Pt meets criteria for underweight based on current BMI.  Estimated Nutritional Needs: Kcal: 25-30 kcal/kg Protein: > 1 gram protein/kg Fluid: 1 ml/kcal  Diet Order: Diet regular Room service appropriate? Yes; Fluid consistency: Thin Pt is also offered choice of unit snacks mid-morning and mid-afternoon.  Pt is eating as desired.   Lab results and medications reviewed.   Dustin FrancoLindsey Jessaca Philippi, MS, RD, LDN Pager: [phone] After Hours Pager: [phone]

## 2016-11-29 NOTE — [facility] Group Notes (Signed)
[facility] LCSW Group Therapy Note  Date/Time:  11/29/2016 10:00-11:00AM  Type of Therapy and Topic:  Group Therapy:  Healthy and Unhealthy Supports  Participation Level:  Did Not Attend   Description of Group:  Patients in this group were introduced to the idea of adding a variety of healthy supports to address the various needs in their lives. The picture on the front of Sunday's workbook was used to demonstrate why more supports are needed in every patient's life.  Patients identified and described healthy supports versus unhealthy supports in general, then gave examples of each in their own lives.   They discussed what additional healthy supports could be helpful in their recovery and wellness after discharge in order to prevent future hospitalizations.   An emphasis was placed on using counselor, doctor, therapy groups, 12-step groups, and problem-specific support groups to expand supports.  They also worked as a group on developing a specific plan for several patients to deal with unhealthy supports through boundary-setting, psychoeducation with loved ones, and even termination of relationships.   Therapeutic Goals:   1)  discuss importance of adding supports to stay well once out of the hospital  2)  compare healthy versus unhealthy supports and identify some examples of each  3)  generate ideas and descriptions of healthy supports that can be added  4)  offer mutual support about how to address unhealthy supports  5)  encourage active participation in and adherence to discharge plan    Summary of Patient Progress:  N/A   Therapeutic Modalities:   Motivational Interviewing Brief Solution-Focused Therapy  Dustin MantleMareida Grossman-Orr, LCSW 11/29/2016, 12:59 PM

## 2016-11-29 NOTE — Progress Notes (Addendum)
Patient ID: Dustin Owens, male   DOB: 1995-10-08, 21 y.o.   MRN: [id number]  Pt currently presents with a flat affect and guarded behavior. Pt seen looking around the room, darting eye contact with interaction. Pt reports to writer that their goal is to "get out of here to see my kids." Pt states "If I have been irritable it is because my ex told me she is moving in with this guy and I am worried about my kids."  Pt reports good sleep with current medication regimen. Pt's mother and aunt visited him today. Pt requests to speak with Clinical research associatewriter privately. Reports ambivalence about his relationship with ex and concerns of relapsing post discharge. Pt blames others often for his actions.   Pt provided with medications per providers orders. Pt's labs and vitals were monitored throughout the night. Pt hypotensive tonight, encouraged fluids. Pt tolerated well. Pt given a 1:1 about emotional and mental status. Pt supported and encouraged to express concerns and questions. Pt educated on medications and DBT.   Pt's safety ensured with 15 minute and environmental checks. Pt currently denies SI/HI. Pt verbally agrees to seek staff if SI/HI or A/VH occurs and to consult with staff before acting on any harmful thoughts. During interaction pt states "I am hallucinating!" Pt reports "seeing the door open at the end of the hallway." Pt then denies AVH to writer. Pt reports to writer "I am going to leave and focus on my children and myself." Will continue POC.

## 2016-11-29 NOTE — [facility] Suicide Risk Assessment (Signed)
[facility] INPATIENT:  Family/Significant Other Suicide Prevention Education  Suicide Prevention Education:  Patient Refusal for Family/Significant Other Suicide Prevention Education: The patient Dustin Owens has refused to provide written consent for family/significant other to be provided Family/Significant Other Suicide Prevention Education during admission and/or prior to discharge.  Physician notified.  Carloyn JaegerMareida J Grossman-Orr 11/29/2016, 1:50 PM

## 2016-11-29 NOTE — Progress Notes (Signed)
Patient did not attend the evening speaker AA meeting. Pt remained in his bed during group time.

## 2016-11-29 NOTE — Progress Notes (Signed)
Data. Patient denies SI/HI/AVH. Verbally contracts for safety on the unit and to come to staff before acting of any self harm thoughts/feelings/voices.  Patient slept until noon. Refused to get up for,  or eat breakfast. Did get up for lunch stating, "I am starving." Patient's VS WDL this noon, but at 5pm were low. Fluids encouraged and patient went and ate a large meal. VS @ 6pm were wnl. Fluids continued to be pushed, while he is up. Action. Emotional support and encouragement offered. Education provided on medication, indications and side effect. Q 15 minute checks done for safety. Response. Safety on the unit maintained through 15 minute checks.  Medications taken as prescribed. Remained calm and appropriate through out shift.

## 2016-11-30 NOTE — Progress Notes (Signed)
Patient ID: Dustin Owens, Dustin Owens   DOB: January 05, 1996, 21 y.o.   MRN: [id number]  Writer spoke with patient this morning and encouraged him to take his medications however patient has yet to do so. He is not coming into the milieu or participating at this time. Q15 minute safety checks are maintained.

## 2016-11-30 NOTE — Plan of Care (Signed)
Problem: Safety: Goal: Ability to remain free from injury will improve Outcome: Progressing Pt remains safe on the unit tonight   Problem: Coping: Goal: Ability to cope will improve Outcome: Progressing Pt discusses upsetting information with staff instead of acting out physically

## 2016-11-30 NOTE — Tx Team (Signed)
Interdisciplinary Treatment and Diagnostic Plan Update  11/30/2016 Time of Session: 0830am Dustin Owens MRN: [id number]  Principal Diagnosis: Bipolar 1 disorder (HCC)  Secondary Diagnoses: Principal Problem:   Bipolar 1 disorder (HCC) Active Problems:   Substance induced mood disorder (HCC)   Current Medications:  Current Facility-Administered Medications  Medication Dose Route Frequency Provider Last Rate Last Dose  . acetaminophen (TYLENOL) tablet 650 mg  650 mg Oral Q6H PRN Oneta Rack, NP   650 mg at 11/28/16 1722  . alum & mag hydroxide-simeth (MAALOX/MYLANTA) 200-200-20 MG/5ML suspension 30 mL  30 mL Oral Q4H PRN Oneta Rack, NP      . carbamazepine (TEGRETOL) chewable tablet 200 mg  200 mg Oral TID Georgiann Cocker, MD   200 mg at 11/29/16 1814  . cloNIDine (CATAPRES) tablet 0.1 mg  0.1 mg Oral QID Nira Conn A, NP   0.1 mg at 11/29/16 2103   Followed by  . [START ON 12/01/2016] cloNIDine (CATAPRES) tablet 0.1 mg  0.1 mg Oral BH-qamhs Jackelyn Poling, NP       Followed by  . [START ON 12/04/2016] cloNIDine (CATAPRES) tablet 0.1 mg  0.1 mg Oral QAC breakfast Nira Conn A, NP      . dicyclomine (BENTYL) tablet 20 mg  20 mg Oral Q6H PRN Nira Conn A, NP      . feeding supplement (ENSURE ENLIVE) (ENSURE ENLIVE) liquid 237 mL  237 mL Oral BID BM Cobos, Rockey Situ, MD   237 mL at 11/29/16 1706  . hydrOXYzine (ATARAX/VISTARIL) tablet 25 mg  25 mg Oral Q6H PRN Nira Conn A, NP   25 mg at 11/29/16 1207  . loperamide (IMODIUM) capsule 2-4 mg  2-4 mg Oral PRN Nira Conn A, NP      . LORazepam (ATIVAN) tablet 1 mg  1 mg Oral Q6H PRN Nira Conn A, NP   1 mg at 11/29/16 2104  . magnesium hydroxide (MILK OF MAGNESIA) suspension 30 mL  30 mL Oral Daily PRN Oneta Rack, NP      . methocarbamol (ROBAXIN) tablet 500 mg  500 mg Oral Q8H PRN Nira Conn A, NP   500 mg at 11/29/16 1207  . nicotine (NICODERM CQ - dosed in mg/24 hours) patch 21 mg  21 mg Transdermal Daily Cobos,  Rockey Situ, MD   21 mg at 11/29/16 1211  . OLANZapine zydis (ZYPREXA) disintegrating tablet 5 mg  5 mg Oral QHS Izediuno, Delight Ovens, MD   5 mg at 11/29/16 2104  . ondansetron (ZOFRAN-ODT) disintegrating tablet 4 mg  4 mg Oral Q6H PRN Nira Conn A, NP      . traZODone (DESYREL) tablet 50 mg  50 mg Oral QHS PRN Oneta Rack, NP   50 mg at 11/29/16 2104   PTA Medications: Prescriptions Prior to Admission  Medication Sig Dispense Refill Last Dose  . carbamazepine (TEGRETOL) 200 MG tablet Take 1 tablet (200 mg total) by mouth 2 (two) times daily. For mood stabilization (Patient not taking: Reported on 11/27/2016) 60 tablet 0 Not Taking at Unknown time  . diphenhydrAMINE (BENADRYL) 25 mg capsule Take 25 mg by mouth every 6 (six) hours as needed (for allergies or itching).   PRN at PRN  . hydrOXYzine (ATARAX/VISTARIL) 50 MG tablet Take 1 tablet (50 mg total) by mouth every 6 (six) hours as needed for anxiety. (Patient not taking: Reported on 11/27/2016) 60 tablet 0 Not Taking at Unknown time  . ibuprofen (ADVIL,MOTRIN) 200  MG tablet Take 200-400 mg by mouth every 6 (six) hours as needed (for pain or headaches).   PRN at PRN  . nicotine polacrilex (NICORETTE) 2 MG gum Take 1 each (2 mg total) by mouth as needed for smoking cessation. (Patient not taking: Reported on 11/27/2016) 100 tablet 0 Not Taking at Unknown time  . zolpidem (AMBIEN) 5 MG tablet Take 1 tablet (5 mg total) by mouth at bedtime as needed for sleep. (Patient not taking: Reported on 11/27/2016) 7 tablet 0 Not Taking at Unknown time    Patient Stressors: Marital or family conflict Medication change or noncompliance Substance abuse  Patient Strengths: Ability for insight Average or above average intelligence Capable of independent living General fund of knowledge  Treatment Modalities: Medication Management, Group therapy, Case management,  1 to 1 session with clinician, Psychoeducation, Recreational therapy.   Physician Treatment  Plan for Primary Diagnosis: Bipolar 1 disorder (HCC) Long Term Goal(s): Improvement in symptoms so as ready for discharge Improvement in symptoms so as ready for discharge   Short Term Goals: Ability to identify changes in lifestyle to reduce recurrence of condition will improve Ability to verbalize feelings will improve Ability to disclose and discuss suicidal ideas Ability to demonstrate self-control will improve Ability to identify and develop effective coping behaviors will improve Ability to maintain clinical measurements within normal limits will improve Compliance with prescribed medications will improve Ability to identify triggers associated with substance abuse/mental health issues will improve Ability to identify changes in lifestyle to reduce recurrence of condition will improve Ability to verbalize feelings will improve Ability to disclose and discuss suicidal ideas Ability to demonstrate self-control will improve Ability to identify and develop effective coping behaviors will improve Ability to maintain clinical measurements within normal limits will improve Compliance with prescribed medications will improve Ability to identify triggers associated with substance abuse/mental health issues will improve  Medication Management: Evaluate patient's response, side effects, and tolerance of medication regimen.  Therapeutic Interventions: 1 to 1 sessions, Unit Group sessions and Medication administration.  Evaluation of Outcomes: Progressing  Physician Treatment Plan for Secondary Diagnosis: Principal Problem:   Bipolar 1 disorder (HCC) Active Problems:   Substance induced mood disorder (HCC)  Long Term Goal(s): Improvement in symptoms so as ready for discharge Improvement in symptoms so as ready for discharge   Short Term Goals: Ability to identify changes in lifestyle to reduce recurrence of condition will improve Ability to verbalize feelings will improve Ability to  disclose and discuss suicidal ideas Ability to demonstrate self-control will improve Ability to identify and develop effective coping behaviors will improve Ability to maintain clinical measurements within normal limits will improve Compliance with prescribed medications will improve Ability to identify triggers associated with substance abuse/mental health issues will improve Ability to identify changes in lifestyle to reduce recurrence of condition will improve Ability to verbalize feelings will improve Ability to disclose and discuss suicidal ideas Ability to demonstrate self-control will improve Ability to identify and develop effective coping behaviors will improve Ability to maintain clinical measurements within normal limits will improve Compliance with prescribed medications will improve Ability to identify triggers associated with substance abuse/mental health issues will improve     Medication Management: Evaluate patient's response, side effects, and tolerance of medication regimen.  Therapeutic Interventions: 1 to 1 sessions, Unit Group sessions and Medication administration.  Evaluation of Outcomes: Progressing   RN Treatment Plan for Primary Diagnosis: Bipolar 1 disorder (HCC) Long Term Goal(s): Knowledge of disease and therapeutic regimen to maintain  health will improve  Short Term Goals: Ability to remain free from injury will improve, Ability to verbalize feelings will improve and Ability to disclose and discuss suicidal ideas  Medication Management: RN will administer medications as ordered by provider, will assess and evaluate patient's response and provide education to patient for prescribed medication. RN will report any adverse and/or side effects to prescribing provider.  Therapeutic Interventions: 1 on 1 counseling sessions, Psychoeducation, Medication administration, Evaluate responses to treatment, Monitor vital signs and CBGs as ordered, Perform/monitor CIWA,  COWS, AIMS and Fall Risk screenings as ordered, Perform wound care treatments as ordered.  Evaluation of Outcomes: Progressing   LCSW Treatment Plan for Primary Diagnosis: Bipolar 1 disorder (HCC) Long Term Goal(s): Safe transition to appropriate next level of care at discharge, Engage patient in therapeutic group addressing interpersonal concerns.  Short Term Goals: Engage patient in aftercare planning with referrals and resources, Facilitate patient progression through stages of change regarding substance use diagnoses and concerns and Identify triggers associated with mental health/substance abuse issues  Therapeutic Interventions: Assess for all discharge needs, 1 to 1 time with Social worker, Explore available resources and support systems, Assess for adequacy in community support network, Educate family and significant other(s) on suicide prevention, Complete Psychosocial Assessment, Interpersonal group therapy.  Evaluation of Outcomes: Progressing   Progress in Treatment: Attending groups: Yes. Participating in groups: Yes. Taking medication as prescribed: Yes. Toleration medication: Yes. Family/Significant other contact made: SPE completed with pt; pt declined to consent to family contact.  Patient understands diagnosis: Yes. Discussing patient identified problems/goals with staff: Yes. Medical problems stabilized or resolved: Yes. Denies suicidal/homicidal ideation: Yes. Issues/concerns per patient self-inventory: No. Other: n/a   New problem(s) identified: No, Describe:  n/a  New Short Term/Long Term Goal(s): medication management for detox/mood stabilization; development of comprehensive mental wellness/sobriety plan; elimination of SI thoughts.  Patient Goal:   Discharge Plan or Barriers: CSW assessing for appropriate referrals. Pt is declining referrals at this point and refusing to attend groups or participate in programming.   Reason for Continuation of  Hospitalization: Anxiety Depression Medication stabilization Suicidal ideation Withdrawal symptoms  Estimated Length of Stay: Wed 12/02/16  Attendees: Patient: 11/30/2016 8:11 AM  Physician: Dr. Jackquline Berlin MD 11/30/2016 8:11 AM  Nursing: Foy Guadalajara RN 11/30/2016 8:11 AM  RN Care Manager: Onnie Boer CM 11/30/2016 8:11 AM  Social Worker: Trula Slade, LCSW 11/30/2016 8:11 AM  Recreational Therapist: x 11/30/2016 8:11 AM  Other: Reola Calkins NP; Hillery Jacks NP 11/30/2016 8:11 AM  Other:  11/30/2016 8:11 AM  Other: 11/30/2016 8:11 AM    Scribe for Treatment Team: Ledell Peoples Smart, LCSW 11/30/2016 8:11 AM

## 2016-11-30 NOTE — Progress Notes (Signed)
Patient ID: Dustin Owens, male   DOB: 10-23-1995, 21 y.o.   MRN: [id number]  DAR: Pt. Denies SI/HI and A/V Hallucinations however patient appears preoccupied. His eye contact is avertive and avoidant. He states, "I'm just trying to keep it together." Patient does not report any pain or discomfort at this time. He is unable to receive his 1200 Clonidine due to his low BP. Patient is encouraged to drink Gatorade and does drink a cup. He is irritable during interaction and forwards little to this Clinical research associatewriter. Support and encouragement provided to the patient however patient is resistant. Patient would not get out of bed for his morning medications. Patient did not get up until noon. He refused to fill out his daily inventory sheet as well. Q15 minute checks are maintained for safety.

## 2016-11-30 NOTE — Progress Notes (Signed)
Recreation Therapy Notes  Date: 11/30/16 Time: 0930 Location: 300 Hall Group Room  Group Topic: Stress Management  Goal Area(s) Addresses:  Patient will verbalize importance of using healthy stress management.  Patient will identify positive emotions associated with healthy stress management.   Intervention: Stress Management  Activity :  Meditation.  LRT introduced the stress management technique of meditation to patients.  LRT played a meditation from the Calm app that focused on humanity.  Patients were to follow along as the meditation played to fully engage in the meditation.  Education:  Stress Management, Discharge Planning.   Education Outcome: Acknowledges edcuation/In group clarification offered/Needs additional education  Clinical Observations/Feedback: Pt did not attend group.    Tenika Keeran, LRT/CTRS         Cortana Vanderford A 11/30/2016 12:25 PM 

## 2016-11-30 NOTE — [facility] Group Notes (Signed)
LCSW Group Therapy Note   11/30/2016 1:15pm   Type of Therapy and Topic:  Group Therapy:  Overcoming Obstacles   Participation Level:  Did Not Attend-pt invited. Continues to isolate in room. Chose to remain in bed.    Description of Group:    In this group patients will be encouraged to explore what they see as obstacles to their own wellness and recovery. They will be guided to discuss their thoughts, feelings, and behaviors related to these obstacles. The group will process together ways to cope with barriers, with attention given to specific choices patients can make. Each patient will be challenged to identify changes they are motivated to make in order to overcome their obstacles. This group will be process-oriented, with patients participating in exploration of their own experiences as well as giving and receiving support and challenge from other group members.   Therapeutic Goals: 1. Patient will identify personal and current obstacles as they relate to admission. 2. Patient will identify barriers that currently interfere with their wellness or overcoming obstacles.  3. Patient will identify feelings, thought process and behaviors related to these barriers. 4. Patient will identify two changes they are willing to make to overcome these obstacles:      Summary of Patient Progress x    Therapeutic Modalities:   Cognitive Behavioral Therapy Solution Focused Therapy Motivational Interviewing Relapse Prevention Therapy  Ledell PeoplesHeather N Smart, LCSW 11/30/2016 11:11 AM

## 2016-11-30 NOTE — Progress Notes (Signed)
[facility] MD Progress Note  11/30/2016 12:47 PM Dustin Owens  MRN:  [id number] Subjective:   21 y.o  Caucasian male, single, homeless, unemplyed. Background history of Bipolar Disorder and SUD. Presented to the ER via EMS. Girlfriend called for help as patient overdosed on fifty pills. Reported to have left a suicide note. Expressed hopelessness and worthlessness. Has been on a drug binge for over three weeks. Daily IVDU. Positive for opiates and amphetamine. BAL 45 mg/dl. Other lab results are essentially normal.   Chart reviewed today. Patient discussed at team team  Staff reports that he continues to isolate self on the unit. Has not participated at unit groups. Has voiced seeing things that are not reality based. Now wants to see his children as he is concerned his ex-girlfriend is leaving town with another man.  Seen today, more pleasant today. Apologized for his behavior yesterday. Says he was not clear headed then. No hallucination lately. Says he has accepted the end of his relationship. He does not have any animosity towards his kids mom or her new lover. Says he plans to get a tattoo when he is discharged. He plans to take his kids at weekend like most dads do. No longer having suicidal thoughts. Says his mom and aunt has been supportive.  Not processing chemical dependence treatment yet.   Principal Problem: Bipolar 1 disorder (HCC) Diagnosis:   Patient Active Problem List   Diagnosis Date Noted  . Substance induced mood disorder (HCC) [F19.94] 11/28/2016  . Bipolar 1 disorder (HCC) [F31.9] 11/27/2016  . Bipolar affective disorder, depressed, severe (HCC) [F31.4] 04/14/2016  . Suicide attempt by drug ingestion (HCC) [T50.902A]   . Cocaine abuse [F14.10] 04/12/2016  . Severe recurrent major depression with psychotic features (HCC) [F33.3] 07/01/2011  . 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 Polysubstance dependence (HCC) [F19.20] 07/01/2011   Total Time spent with  patient: 20 minutes  Past Psychiatric History: As in H&P  Past Medical History:  Past Medical History:  Diagnosis Date  . Allergy   . Anxiety   . Depression   . Headache(784.0)   . Vision abnormalities     Past Surgical History:  Procedure Laterality Date  . NO PAST SURGERIES     Family History:  Family History  Problem Relation Age of Onset  . OCD Mother   . Drug abuse Mother   . Schizophrenia Father   . Drug abuse Father    Family Psychiatric  History: As in H&P Social History:  History  Alcohol Use  . 0.5 oz/week  . 1 Standard drinks or equivalent per week    Comment: 1 - 1/2 5ths of bourbon     History  Drug Use  . Frequency: 7.0 times per week  . Types: Amphetamines, Benzodiazepines, Cocaine, Codeine, Fentanyl, Hashish, Heroin, Hydrocodone, Hydromorphone, LSD, Marijuana, MDMA (Ecstacy), Methamphetamines, Morphine, Nitrous oxide, Opium, PCP, Solvent inhalants    Social History   Social History  . Marital status: Single    Spouse name: N/A  . Number of children: N/A  . Years of education: N/A   Occupational History  . student     11th grade at a [employer] facility   Social History Main Topics  . Smoking status: Current Every Day Smoker    Packs/day: 1.00    Years: 5.00    Types: Cigarettes  . Smokeless tobacco: Never Used  . Alcohol use 0.5 oz/week    1 Standard drinks or equivalent per week  Comment: 1 - 1/2 5ths of bourbon  . Drug use: Yes    Frequency: 7.0 times per week    Types: Amphetamines, Benzodiazepines, Cocaine, Codeine, Fentanyl, Hashish, Heroin, Hydrocodone, Hydromorphone, LSD, Marijuana, MDMA (Ecstacy), Methamphetamines, Morphine, Nitrous oxide, Opium, PCP, Solvent inhalants  . Sexual activity: Yes    Partners: Female    Birth control/ protection: Condom   Other Topics Concern  . None   Social History Narrative  . None   Additional Social History:      Sleep: Good  Appetite:  Good  Current Medications: Current  Facility-Administered Medications  Medication Dose Route Frequency Provider Last Rate Last Dose  . acetaminophen (TYLENOL) tablet 650 mg  650 mg Oral Q6H PRN Oneta RackLewis, Tanika N, NP   650 mg at 11/28/16 1722  . alum & mag hydroxide-simeth (MAALOX/MYLANTA) 200-200-20 MG/5ML suspension 30 mL  30 mL Oral Q4H PRN Oneta RackLewis, Tanika N, NP      . carbamazepine (TEGRETOL) chewable tablet 200 mg  200 mg Oral TID Georgiann CockerIzediuno, Chaniah Cisse A, MD   200 mg at 11/30/16 1207  . cloNIDine (CATAPRES) tablet 0.1 mg  0.1 mg Oral QID Nira ConnBerry, Jason A, NP   0.1 mg at 11/29/16 2103   Followed by  . [START ON 12/01/2016] cloNIDine (CATAPRES) tablet 0.1 mg  0.1 mg Oral BH-qamhs Jackelyn PolingBerry, Jason A, NP       Followed by  . [START ON 12/04/2016] cloNIDine (CATAPRES) tablet 0.1 mg  0.1 mg Oral QAC breakfast Nira ConnBerry, Jason A, NP      . dicyclomine (BENTYL) tablet 20 mg  20 mg Oral Q6H PRN Nira ConnBerry, Jason A, NP      . feeding supplement (ENSURE ENLIVE) (ENSURE ENLIVE) liquid 237 mL  237 mL Oral BID BM Cobos, Rockey SituFernando A, MD   237 mL at 11/29/16 1706  . hydrOXYzine (ATARAX/VISTARIL) tablet 25 mg  25 mg Oral Q6H PRN Nira ConnBerry, Jason A, NP   25 mg at 11/29/16 1207  . loperamide (IMODIUM) capsule 2-4 mg  2-4 mg Oral PRN Nira ConnBerry, Jason A, NP      . LORazepam (ATIVAN) tablet 1 mg  1 mg Oral Q6H PRN Nira ConnBerry, Jason A, NP   1 mg at 11/29/16 2104  . magnesium hydroxide (MILK OF MAGNESIA) suspension 30 mL  30 mL Oral Daily PRN Oneta RackLewis, Tanika N, NP      . methocarbamol (ROBAXIN) tablet 500 mg  500 mg Oral Q8H PRN Nira ConnBerry, Jason A, NP   500 mg at 11/29/16 1207  . nicotine (NICODERM CQ - dosed in mg/24 hours) patch 21 mg  21 mg Transdermal Daily Cobos, Rockey SituFernando A, MD   21 mg at 11/30/16 1208  . OLANZapine zydis (ZYPREXA) disintegrating tablet 5 mg  5 mg Oral QHS Tiburcio Linder, Delight OvensVincent A, MD   5 mg at 11/29/16 2104  . ondansetron (ZOFRAN-ODT) disintegrating tablet 4 mg  4 mg Oral Q6H PRN Nira ConnBerry, Jason A, NP      . traZODone (DESYREL) tablet 50 mg  50 mg Oral QHS PRN Oneta RackLewis, Tanika N, NP    50 mg at 11/29/16 2104    Lab Results: No results found for this or any previous visit (from the past 48 hour(s)).  Blood Alcohol level:  Lab Results  Component Value Date   ETH 45 (H) 11/26/2016   ETH <5 04/11/2016    Metabolic Disorder Labs: No results found for: HGBA1C, MPG No results found for: PROLACTIN No results found for: CHOL, TRIG, HDL, CHOLHDL, VLDL, LDLCALC  Physical Findings:  AIMS: Facial and Oral Movements Muscles of Facial Expression: None, normal Lips and Perioral Area: None, normal Jaw: None, normal Tongue: None, normal,Extremity Movements Upper (arms, wrists, hands, fingers): None, normal Lower (legs, knees, ankles, toes): None, normal, Trunk Movements Neck, shoulders, hips: None, normal, Overall Severity Severity of abnormal movements (highest score from questions above): None, normal Incapacitation due to abnormal movements: None, normal Patient's awareness of abnormal movements (rate only patient's report): No Awareness, Dental Status Current problems with teeth and/or dentures?: No Does patient usually wear dentures?: No  CIWA:  CIWA-Ar Total: 2 COWS:  COWS Total Score: 7  Musculoskeletal: Strength & Muscle Tone: within normal limits Gait & Station: normal Patient leans: N/A  Psychiatric Specialty Exam: Physical Exam  Constitutional: No distress.  Respiratory: Effort normal.  Skin: He is not diaphoretic.  Psychiatric:  As above    ROS  Blood pressure (!) 77/49, pulse 73, temperature (!) 97 F (36.1 C), temperature source Oral, resp. rate 16, height 6' (1.829 m), weight 60.3 kg (133 lb).Body mass index is 18.04 kg/m.  General Appearance:  Was interacting with his room mate just prior to interview. Pleasant and cooperative. Showed me pictures of his kids. Appropriate behavior.   Eye Contact:  Good  Speech:  Normal tone and rate  Volume:  Normal  Mood:  Feeling better  Affect:  Mobilizing some positive affect  Thought Process:  Linear   Orientation:  Fully oriented to person, place and time   Thought Content:  No delusional theme. No preoccupation with violent thoughts. No negative ruminations. No obsession.  No hallucination in any modality.   Suicidal Thoughts:  None today  Homicidal Thoughts:  No  Memory:  WNL  Judgement: Fair  Insight:  Shallow  Psychomotor Activity:  Restlessness  Concentration:  Poor  Recall:  Unable to assess at this time.   Fund of Knowledge:  Fair  Language:  Good  Akathisia:  Negative  Handed:    AIMS (if indicated):     Assets:  Physical Health  ADL's:  Good  Cognition:  Normal  Sleep:  Number of Hours: 6.5     Treatment Plan Summary:  Psychoactive substances withdrawals are progressively getting better. He still has transient psychosis. He remains at high risk to self. He needs further stabilization.   Psychiatric: Bipolar disorder ,,,, current episode is depression SUD SIMD  Medical:  Psychosocial:  Job loss Loss of access to his kids Homelessness  PLAN: 1. Continue  medications at current dose 2. Continue to monitor mood, behavior and interaction with peers 3. Continue to encourage unit groups and activities  Georgiann Cocker, MD 11/30/2016, 12:47 PMPatient ID: Dustin Owens, male   DOB: 1995-10-21, 21 y.o.   MRN: [id number]

## 2016-11-30 NOTE — Progress Notes (Signed)
Pt attend AA group. 

## 2016-12-01 DIAGNOSIS — F39 Unspecified mood [affective] disorder: Secondary | ICD-10-CM

## 2016-12-01 DIAGNOSIS — G4700 Insomnia, unspecified: Secondary | ICD-10-CM

## 2016-12-01 DIAGNOSIS — Z630 Problems in relationship with spouse or partner: Secondary | ICD-10-CM

## 2016-12-01 NOTE — Progress Notes (Signed)
[facility] Group Notes:  (Nursing/MHT/Case Management/Adjunct)  Date:  12/01/2016  Time:  0930  Type of Therapy:  Nurse Education  Participation Level:  Did Not Attend  Participation Quality:    Affect:  Cognitive:    Insight:   Engagement in Group:  Modes of Intervention:   Summary of Progress/Problems:  Beatrix ShipperWright, Railee Bonillas Martin 12/01/2016, 4:59 PM

## 2016-12-01 NOTE — Progress Notes (Signed)
Psychoeducational Group Note  Date:  12/01/2016 Time:  2100 Group Topic/Focus:  wrap up group  Participation Level: Did Not Attend  Participation Quality:  Not Applicable  Affect:  Not Applicable  Cognitive:  Not Applicable  Insight:  Not Applicable  Engagement in Group: Not Applicable  Additional Comments:  Pt was notified that group was beginning but reported a headache which he received medication for and returned to his bed.   Marcille BuffyMcNeil, Laurence Crofford S 12/01/2016, 11:39 PM

## 2016-12-01 NOTE — Progress Notes (Addendum)
[facility] MD Progress Note  12/01/2016 9:04 AM Dustin Owens  MRN:  [id number] Subjective:  Patient reports he is starting to feel better . He reports " I have a lot to live for, my children are beautiful", and expresses motivation for further improvement. At this time denies medication side effects.  Objective : I have discussed case with treatment team and have met with patient . Patient is 21 year old male, reports history of Bipolar Disorder, admitted due to depression, suicide attempt by overdosing. He reports history of substance dependence, and identifies methamphetamine as substance of choice, but states he has " slowed down a lot recently". Admission BAL 45, Admission UDS positive for Opiates and Amphetamines . At this time patient does not endorse withdrawal symptoms, vitals are stable. As reviewed with staff, patient has been withdrawn, irritable, dysphoric. Today he is partially improved, better related, more communicative, and stating he plans to start participating in groups more.  Denies suicidal ideations ,and is future oriented . States recent break up  with GF was a major stressor, but states " I am not as upset about it anymore, I just want to be there for my kids, I want to be a part of their life "  Principal Problem: Bipolar 1 disorder ([place]) Diagnosis:   Patient Active Problem List   Diagnosis Date Noted  . Substance induced mood disorder ([place]) [F19.94] 11/28/2016  . Bipolar 1 disorder ([place]) [F31.9] 11/27/2016  . Bipolar affective disorder, depressed, severe ([place]) [F31.4] 04/14/2016  . Suicide attempt by drug ingestion ([place]) [T50.902A]   . Cocaine abuse [F14.10] 04/12/2016  . Severe recurrent major depression with psychotic features ([place]) [F33.3] 07/01/2011  . 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 Polysubstance dependence ([place]) [F19.20] 07/01/2011   Total Time spent with patient: 20 minutes  Past Psychiatric History: As in H&P  Past Medical History:   Past Medical History:  Diagnosis Date  . Allergy   . Anxiety   . Depression   . Headache(784.0)   . Vision abnormalities     Past Surgical History:  Procedure Laterality Date  . NO PAST SURGERIES     Family History:  Family History  Problem Relation Age of Onset  . OCD Mother   . Drug abuse Mother   . Schizophrenia Father   . Drug abuse Father    Family Psychiatric  History: As in H&P Social History:  History  Alcohol Use  . 0.5 oz/week  . 1 Standard drinks or equivalent per week    Comment: 1 - 1/2 5ths of bourbon     History  Drug Use  . Frequency: 7.0 times per week  . Types: Amphetamines, Benzodiazepines, Cocaine, Codeine, Fentanyl, Hashish, Heroin, Hydrocodone, Hydromorphone, LSD, Marijuana, MDMA (Ecstacy), Methamphetamines, Morphine, Nitrous oxide, Opium, PCP, Solvent inhalants    Social History   Social History  . Marital status: Single    Spouse name: N/A  . Number of children: N/A  . Years of education: N/A   Occupational History  . student     11th grade at a [employer] facility   Social History Main Topics  . Smoking status: Current Every Day Smoker    Packs/day: 1.00    Years: 5.00    Types: Cigarettes  . Smokeless tobacco: Never Used  . Alcohol use 0.5 oz/week    1 Standard drinks or equivalent per week     Comment: 1 - 1/2 5ths of bourbon  . Drug use: Yes  Frequency: 7.0 times per week    Types: Amphetamines, Benzodiazepines, Cocaine, Codeine, Fentanyl, Hashish, Heroin, Hydrocodone, Hydromorphone, LSD, Marijuana, MDMA (Ecstacy), Methamphetamines, Morphine, Nitrous oxide, Opium, PCP, Solvent inhalants  . Sexual activity: Yes    Partners: Female    Birth control/ protection: Condom   Other Topics Concern  . None   Social History Narrative  . None   Additional Social History:      Sleep: Good  Appetite:  Good  Current Medications: Current Facility-Administered Medications  Medication Dose Route Frequency Provider Last Rate Last  Dose  . acetaminophen (TYLENOL) tablet 650 mg  650 mg Oral Q6H PRN Derrill Center, NP   650 mg at 11/28/16 1722  . alum & mag hydroxide-simeth (MAALOX/MYLANTA) 200-200-20 MG/5ML suspension 30 mL  30 mL Oral Q4H PRN Derrill Center, NP      . carbamazepine (TEGRETOL) chewable tablet 200 mg  200 mg Oral TID Artist Beach, MD   200 mg at 11/30/16 1702  . cloNIDine (CATAPRES) tablet 0.1 mg  0.1 mg Oral QID Lindon Romp A, NP   0.1 mg at 11/30/16 2046   Followed by  . cloNIDine (CATAPRES) tablet 0.1 mg  0.1 mg Oral BH-qamhs Rozetta Nunnery, NP       Followed by  . [START ON 12/04/2016] cloNIDine (CATAPRES) tablet 0.1 mg  0.1 mg Oral QAC breakfast Lindon Romp A, NP      . dicyclomine (BENTYL) tablet 20 mg  20 mg Oral Q6H PRN Lindon Romp A, NP      . feeding supplement (ENSURE ENLIVE) (ENSURE ENLIVE) liquid 237 mL  237 mL Oral BID BM Chriss Redel, Myer Peer, MD   237 mL at 11/30/16 1702  . hydrOXYzine (ATARAX/VISTARIL) tablet 25 mg  25 mg Oral Q6H PRN Lindon Romp A, NP   25 mg at 11/29/16 1207  . loperamide (IMODIUM) capsule 2-4 mg  2-4 mg Oral PRN Lindon Romp A, NP      . magnesium hydroxide (MILK OF MAGNESIA) suspension 30 mL  30 mL Oral Daily PRN Derrill Center, NP      . methocarbamol (ROBAXIN) tablet 500 mg  500 mg Oral Q8H PRN Lindon Romp A, NP   500 mg at 11/29/16 1207  . nicotine (NICODERM CQ - dosed in mg/24 hours) patch 21 mg  21 mg Transdermal Daily Briani Maul, Myer Peer, MD   21 mg at 11/30/16 1208  . OLANZapine zydis (ZYPREXA) disintegrating tablet 5 mg  5 mg Oral QHS Izediuno, Laruth Bouchard, MD   5 mg at 11/30/16 2045  . ondansetron (ZOFRAN-ODT) disintegrating tablet 4 mg  4 mg Oral Q6H PRN Lindon Romp A, NP      . traZODone (DESYREL) tablet 50 mg  50 mg Oral QHS PRN Derrill Center, NP   50 mg at 11/30/16 2046    Lab Results: No results found for this or any previous visit (from the past 48 hour(s)).  Blood Alcohol level:  Lab Results  Component Value Date   ETH 45 (H) 11/26/2016    ETH <5 59/93/5701    Metabolic Disorder Labs: No results found for: HGBA1C, MPG No results found for: PROLACTIN No results found for: CHOL, TRIG, HDL, CHOLHDL, VLDL, LDLCALC  Physical Findings: AIMS: Facial and Oral Movements Muscles of Facial Expression: None, normal Lips and Perioral Area: None, normal Jaw: None, normal Tongue: None, normal,Extremity Movements Upper (arms, wrists, hands, fingers): None, normal Lower (legs, knees, ankles, toes): None, normal, Trunk Movements Neck, shoulders,  hips: None, normal, Overall Severity Severity of abnormal movements (highest score from questions above): None, normal Incapacitation due to abnormal movements: None, normal Patient's awareness of abnormal movements (rate only patient's report): No Awareness, Dental Status Current problems with teeth and/or dentures?: No Does patient usually wear dentures?: No  CIWA:  CIWA-Ar Total: 10 COWS:  COWS Total Score: 5  Musculoskeletal: Strength & Muscle Tone: within normal limits Gait & Station: normal Patient leans: N/A  Psychiatric Specialty Exam: Physical Exam  Constitutional: No distress.  Respiratory: Effort normal.  Skin: He is not diaphoretic.  Psychiatric:  As above    ROS denies chest pain, no shortness of breath, no vomiting, no nausea,no fever  Blood pressure (!) 101/59, pulse 85, temperature 97.7 F (36.5 C), temperature source Oral, resp. rate 16, height 6' (1.829 m), weight 60.3 kg (133 lb).Body mass index is 18.04 kg/m.  General Appearance:  Improving grooming , better related today.   Eye Contact:  Good  Speech:  Normal   Volume:  Normal  Mood:  Improving, less depressed, less dysphoric   Affect:  Improving range of affect today, presents less irritable   Thought Process:  Linear, associations intact  Orientation:  Fully oriented to person, place and time   Thought Content:  No hallucinations, no delusions, not internally preoccupied   Suicidal Thoughts:  Denies  suicidal or self injurious ideations, denies any homicidal ideations   Homicidal Thoughts:  No  Memory:  Recent and remote grossly intact   Judgement: improving   Insight:  Improving   Psychomotor Activity:  Normal  Concentration:  Improved  Recall:  Unable to assess at this time.   Fund of Knowledge: good   Language:  Good  Akathisia:  Negative  Handed:    AIMS (if indicated):     Assets:  Physical Health Resilience Social Support  ADL's:  Good  Cognition:  Normal  Sleep:  Number of Hours: 6.75    Assessment - patient is presenting with gradual improvement, and today presents less dysphoric, less irritable, more visible on unit .  He is  more future oriented, and states that he is tolerating medications well at this time ( On Carbamazepine and Olanzapine for mood disorder )  No current significant withdrawal symptoms at this time.   Treatment Plan Summary: Treatment Plan reviewed as below today 9/4  Continue to encourage group and milieu participation to work on coping skills and symptom reduction Continue to encourage sobriety , abstinence efforts  Continue Tegretol 200 mgrs TID for mood disorder  Continue Zyprexa 5 mgrs QHS for mood disorder  Continue Trazodone 50 mgrs QHS PRN insomnia  At this time completing Clonidine detox taper, to minimize potential symptoms of WDL Check Carbamazepine serum level in AM, also check Lipid Panel, HgbA1C as on Zyprexa trial Treatment team working on disposition planning . Patient states he wants outpatient treatment rather than rehab .  Jenne Campus, MD 12/01/2016, 9:04 AM   Patient ID: Jules Husbands, male   DOB: Jul 22, 1995, 35 y.o.   MRN: [id number]

## 2016-12-01 NOTE — Progress Notes (Signed)
Patient ID: Dustin Owens, male   DOB: 04-05-1995, 21 y.o.   MRN: [id number]  D: Patient lying in bed with eyes closed. Appears to be sleeping at present.7-11pm nurse give ativan 1mg  for withdrawal symptoms and anxiety. No other complaints tonight thus far. A: Staff will monitor on q 15 minute checks, follow treatment plan, and give medications as ordered. R: Cooperative on the unit

## 2016-12-01 NOTE — [facility] Group Notes (Signed)
[facility] Therapy 12/01/2016 1:15pm  Type of Therapy: Mental Health Association Presentation  Participation Level: Active  Participation Quality: Attentive  Affect: Appropriate  Cognitive: Oriented  Insight: Developing/Improving  Engagement in Therapy: Engaged  Modes of Intervention: Discussion, Education and Socialization  Summary of Progress/Problems: Mental Health Association (MHA) Speaker came to talk about his personal journey with mental health. The pt processed ways by which to relate to the speaker. MHA speaker provided handouts and educational information pertaining to groups and services offered by [facility]. Pt was engaged in speaker's presentation and was receptive to resources provided.   Trula SladeHeather Smart, MSW, LCSW Clinical Social Worker 12/01/2016 1:57 PM

## 2016-12-01 NOTE — Progress Notes (Signed)
D:Pt was irritable this morning and would not get out of bed and take his morning medications. He has been in bed much of the day not attending group. Pt's mood has been better in the afternoon. He says that he is responsible for having to come to the hospital and [facility]. Pt says that he wants to learn to control his actions and see his children. A:Offered support, encouragement and 15 minute checks. R:Pt denies si and hi. Safety maintained on the unit.

## 2016-12-01 NOTE — Progress Notes (Signed)
Recreation Therapy Notes  Animal-Assisted Activity (AAA) Program Checklist/Progress Notes Patient Eligibility Criteria Checklist & Daily Group note for Rec TxIntervention  Date: 09.04.2018 Time: 2:45pm Location: [address] Dayroom   AAA/T Program Assumption of Risk Form signed by Patient/ or Parent Legal Guardian Yes  Patient is free of allergies or sever asthma Yes  Patient reports no fear of animals Yes  Patient reports no history of cruelty to animals Yes  Patient understands his/her participation is voluntary Yes  Patient washes hands before animal contact Yes  Patient washes hands after animal contact Yes  Behavioral Response: Did not attend.   Chucky Homes L Ellianna Ruest, LRT/CTRS        Gaither Biehn L 12/01/2016 3:02 PM 

## 2016-12-02 LAB — HEMOGLOBIN A1C
HEMOGLOBIN A1C: 4.9 % (ref 4.8–5.6)
MEAN PLASMA GLUCOSE: 93.93 mg/dL

## 2016-12-02 LAB — LIPID PANEL
CHOL/HDL RATIO: 3 ratio
Cholesterol: 146 mg/dL (ref 0–200)
HDL: 48 mg/dL (ref >40)
LDL Cholesterol: 78 mg/dL (ref 0–99)
Triglycerides: 102 mg/dL (ref <150)
VLDL: 20 mg/dL (ref 0–40)

## 2016-12-02 MED ORDER — CARBAMAZEPINE 200 MG PO TABS
200.0000 mg | ORAL_TABLET | Freq: Three times a day (TID) | ORAL | Status: DC
  Administered 2016-12-02: 200 mg via ORAL
  Filled 2016-12-02 (×3): qty 21
  Filled 2016-12-02: qty 1
  Filled 2016-12-02 (×3): qty 21

## 2016-12-02 MED ORDER — OLANZAPINE 5 MG PO TBDP: 5.0000 mg | ORAL_TABLET | Freq: Every day | ORAL | 0 refills | Status: DC

## 2016-12-02 MED ORDER — HYDROXYZINE HCL 25 MG PO TABS: 25.0000 mg | ORAL_TABLET | Freq: Four times a day (QID) | ORAL | 0 refills | Status: DC | PRN

## 2016-12-02 MED ORDER — TRAZODONE HCL 50 MG PO TABS: 50.0000 mg | ORAL_TABLET | Freq: Every evening | ORAL | 0 refills | Status: DC | PRN

## 2016-12-02 MED ORDER — CARBAMAZEPINE 200 MG PO TABS: 200.0000 mg | ORAL_TABLET | Freq: Three times a day (TID) | ORAL | 0 refills | Status: DC

## 2016-12-02 NOTE — B[facility] Suicide Risk Assessment (Signed)
[facility] Discharge Suicide Risk Assessment   Principal Problem: Bipolar 1 disorder [facility]) Discharge Diagnoses:  Patient Active Problem List   Diagnosis Date Noted  . Substance induced mood disorder (HCC) [F19.94] 11/28/2016  . Bipolar 1 disorder (HCC) [F31.9] 11/27/2016  . Bipolar affective disorder, depressed, severe (HCC) [F31.4] 04/14/2016  . Suicide attempt by drug ingestion (HCC) [T50.902A]   . Cocaine abuse [F14.10] 04/12/2016  . Severe recurrent major depression with psychotic features (HCC) [F33.3] 07/01/2011  . 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 Polysubstance dependence (HCC) [F19.20] 07/01/2011    Total Time spent with patient: 30 minutes  Musculoskeletal: Strength & Muscle Tone: within normal limits Gait & Station: normal Patient leans: N/A  Psychiatric Specialty Exam: ROS no headache, no vomiting, no fever, no chills   Blood pressure 107/70, pulse 79, temperature 98.2 F (36.8 C), temperature source Oral, resp. rate 18, height 6' (1.829 m), weight 60.3 kg (133 lb).Body mass index is 18.04 kg/m.  General Appearance: fairly groomed- improving   Eye Contact::  Good  Speech:  Normal Rate409  Volume:  Normal  Mood:  improving , states he feels better than prior to admission, denies significant depression at this time  Affect:  Appropriate and fuller in range  Thought Process:  Linear and Descriptions of Associations: Intact  Orientation:  Full (Time, Place, and Person)  Thought Content:  denies hallucinations, no delusions, not internally preoccupied  Suicidal Thoughts:  No denies any suicidal or self injurious ideations, denies any homicidal ideations  Homicidal Thoughts:  No  Memory:  recent and remote grossly intact   Judgement:  Other:  improving   Insight:  improving   Psychomotor Activity:  Normal  Concentration:  Good  Recall:  Good  Fund of Knowledge:Good  Language: Good  Akathisia:  Negative  Handed:  Right  AIMS (if indicated):    no abnormal or involuntary movements noted/reported   Assets:  Communication Skills Desire for Improvement Resilience  Sleep:  Number of Hours: 5.75  Cognition: WNL  ADL's:  Intact   Mental Status Per Nursing Assessment::   On Admission:     Demographic Factors:  21 year old single male  Loss Factors: Unemployment, break up  Historical Factors: Reports history of Bipolar Disorder diagnosis, history of substance abuse   Risk Reduction Factors:   Responsible for children under 46 years of age, Living with another person, especially a relative and Positive coping skills or problem solving skills  Continued Clinical Symptoms:  At this time patient reports feeling much better, and is expressing readiness for discharge. He is in good spirits today and denies significant depression. Affect is appropriate and reactive . No thought disorder, no SI or HI, no psychotic symptoms and is future oriented, looking forward to seeing his children and hopefully being able to take them to the zoo.  Denies cravings. Behavior on unit in good control, presents pleasant on approach. Thus far tolerating Tegretol and Zyprexa , denies medication side effects  Cognitive Features That Contribute To Risk:  No gross cognitive deficits noted upon discharge. Is alert , attentive, and oriented x 3   Suicide Risk:  Mild:  Suicidal ideation of limited frequency, intensity, duration, and specificity.  There are no identifiable plans, no associated intent, mild dysphoria and related symptoms, good self-control (both objective and subjective assessment), few other risk factors, and identifiable protective factors, including available and accessible social support.  Follow-up Information    Patient is currently refusing all follow-up appointments. Follow  up.           Plan Of Care/Follow-up recommendations:  Activity:  as tolerated  Diet:  regular Tests:  NA Other:  see below   Expressing feeling better, and  wanting to discharge .  Patient is leaving unit in good spirits  Plans to return home- lives with aunt . [facility] [place]  Craige CottaFernando A Joh Rao, MD 12/02/2016, 10:12 AM

## 2016-12-02 NOTE — Progress Notes (Signed)
  [facility]charge [facility] :  Will you be returning to the same living situation after discharge:  Yes,  home At discharge, do you have transportation home?: Yes,  pt's mother coming at 4pm Do you have the ability to pay for your medications: Yes,  mental health  Release of information consent forms completed and submitted to medical records by CSW.   Patient to Follow up at: Follow-up Information    [facility] Follow up.   Why:  Phone and fax lines currently down. Please walk in within 7 days following hospital discharge to be seen for mental health services. Walk in at 9:00AM Monday-Friday. Please bring photo ID. Thank you.  Contact information: [address] [place] [id number] [phone]           Next level of care provider has access to [facility] Link:no  Safety Planning and Suicide Prevention discussed: Yes,  SPE completed with pt; pt declined to consent to family contact. SPI pamphlet and Mobile Crisis information provided.  Have you used any form of tobacco in the last 30 days? (Cigarettes, Smokeless Tobacco, Cigars, and/or Pipes): Yes  Has patient been referred to the Quitline?: Patient refused referral  Patient has been referred for addiction treatment: Yes  Pulte HomesHeather N Smart, LCSW 12/02/2016, 10:24 AM

## 2016-12-02 NOTE — Progress Notes (Signed)
Recreation Therapy Notes  Date: 12/02/16 Time: 0930 Location: [address] Dayroom  Group Topic: Stress Management  Goal Area(s) Addresses:  Patient will verbalize importance of using healthy stress management.  Patient will identify positive emotions associated with healthy stress management.   Intervention: Stress Management  Activity :  [place].  LRT introduced the stress management technique of guided imagery.  LRT read a script to allow patients to engage in a mental vacation in the wilderness.  Patients were to follow along as the script was read to participate in the activity.  Education:  Stress Management, Discharge Planning.   Education Outcome: Acknowledges edcuation/In group clarification offered/Needs additional education  Clinical Observations/Feedback: Pt did not attend group.   Rakel Junio, LRT/CTRS         Kristyna Bradstreet A 12/02/2016 1:19 PM 

## 2016-12-02 NOTE — Progress Notes (Signed)
D: Patient observed asleep in bed. Did come up for AM meds per this writer's request. Patient states due to his substance abuse, he would go days without sleeping. States he is catching up. Patient's affect anxious but brightens with interaction. Mood anxious but pleasant. Per self inventory and discussions with writer, rates depression at a 1/10 and anxiety at a 1/10. Rates sleep as good, appetite as good, energy as normal and concentration as good.  States goal for today is to "prepare to leave and how I will handle when I see my children. Talk with family, nurse, peers." Denies pain, physical problems. Denies withdrawal symptoms this AM.   A: Medicated per orders, no prns requested or required. Level III obs in place for safety. Emotional support offered and self inventory reviewed. Encouraged completion of Suicide Safety Plan and programming participation. Discussed POC with MD, SW.   R: Patient verbalizes understanding of POC. Patient for possible discharge. MD to eval. Patient denies SI/HI/AVH and remains safe on level III obs. Will continue to monitor closely and make verbal contact frequently.

## 2016-12-02 NOTE — Progress Notes (Signed)
Patient verbalizes readiness for discharge. Follow up plan explained, AVS, transition record and SRA given along with prescriptions. Sample meds provided. All belongings returned. Patient verbalizes understanding. Denies SI/HI and assures this Clinical research associatewriter he will seek assistance should that change. Patient discharged ambulatory and in stable condition to his mother.

## 2016-12-02 NOTE — Discharge Summary (Signed)
Physician Discharge Summary Note  Patient:  Dustin Owens is an 21 y.o., male MRN:  [id number] DOB:  1996-03-09 Patient phone:  [phone] (home)  Patient address:   [address] [place] [address],  Total Time spent with patient: 20 minutes  Date of Admission:  11/27/2016 Date of Discharge: 12/02/16   Reason for Admission:  Worsening depression with SI and suicide attempt by overdose  Principal Problem: Bipolar 1 disorder [facility]) Discharge Diagnoses: Patient Active Problem List   Diagnosis Date Noted  . Substance induced mood disorder ([place]) [F19.94] 11/28/2016  . Bipolar 1 disorder ([place]) [F31.9] 11/27/2016  . Bipolar affective disorder, depressed, severe ([place]) [F31.4] 04/14/2016  . Suicide attempt by drug ingestion ([place]) [T50.902A]   . Cocaine abuse [F14.10] 04/12/2016  . Severe recurrent major depression with psychotic features ([place]) [F33.3] 07/01/2011  . 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 Polysubstance dependence [facility]) [F19.20] 07/01/2011    Past Psychiatric History: Bipolar Disorder and SUD. This is his third hospitalization here. He was last stabilized on Tegretol. Last admission was in context of suicidal behavior. Did not follow up post discharge  Past Medical History:  Past Medical History:  Diagnosis Date  . Allergy   . Anxiety   . Depression   . Headache(784.0)   . Vision abnormalities     Past Surgical History:  Procedure Laterality Date  . NO PAST SURGERIES     Family History:  Family History  Problem Relation Age of Onset  . OCD Mother   . Drug abuse Mother   . Schizophrenia Father   . Drug abuse Father    Family Psychiatric  History: See directly above Social History:  History  Alcohol Use  . 0.5 oz/week  . 1 Standard drinks or equivalent per week    Comment: 1 - 1/2 5ths of bourbon     History  Drug Use  . Frequency: 7.0 times per week  . Types: Amphetamines, Benzodiazepines, Cocaine, Codeine, Fentanyl, Hashish,  Heroin, Hydrocodone, Hydromorphone, LSD, Marijuana, MDMA (Ecstacy), Methamphetamines, Morphine, Nitrous oxide, Opium, PCP, Solvent inhalants    Social History   Social History  . Marital status: Single    Spouse name: N/A  . Number of children: N/A  . Years of education: N/A   Occupational History  . student     11th grade at a [employer] facility   Social History Main Topics  . Smoking status: Current Every Day Smoker    Packs/day: 1.00    Years: 5.00    Types: Cigarettes  . Smokeless tobacco: Never Used  . Alcohol use 0.5 oz/week    1 Standard drinks or equivalent per week     Comment: 1 - 1/2 5ths of bourbon  . Drug use: Yes    Frequency: 7.0 times per week    Types: Amphetamines, Benzodiazepines, Cocaine, Codeine, Fentanyl, Hashish, Heroin, Hydrocodone, Hydromorphone, LSD, Marijuana, MDMA (Ecstacy), Methamphetamines, Morphine, Nitrous oxide, Opium, PCP, Solvent inhalants  . Sexual activity: Yes    Partners: Female    Birth control/ protection: Condom   Other Topics Concern  . None   Social History Narrative  . None    Hospital Course:   21 y.o  Caucasian male, single, homeless, unemplyed. Background history of Bipolar Disorder and SUD. Presented to the ER via EMS. Girlfriend called for help as patient overdosed on fifty pills. Reported to have left a suicide note. Expressed hopelessness and worthlessness. Has been on a drug binge for over three weeks. Daily  IVDU. Positive for opiates and amphetamine. BAL 45 mg/dl. Other lab results are essentially normal.  At interview, patient states that his ex-girlfriend made her lose her job. Says she told her employers to carry out a drug test. Says he was positive and got fired. He has been off work for four months. Says he sold his things to scrap by. Says his mom and aunt has been supportive. Patient says he had given up on life. He has been using drugs on a daily basis. Injects amphetamine , heroin and drinks alcohol occasionally. "  I have been ripping my own life apart ,,,, nothing more to live for ,,,,, I had a wonderful job ,,,, my license has been taken away ,,,, I have not been able to see my kids ,,,, I was trying to let her know how much she has fucked up my life" Says he was at his mom's place when he took the OD. Not sure what medication he took. Says he was on Facetime with his girlfriend. She saw what he did and alerted his mom. Says he does not care anymore. Feels he has nothing to live for. Says he has met his lifetime expectations already. Tells me that suicide has been in his conscious mind for the past couple of weeks. Feels all we can do is delay it by keeping him here. Says he does not care anymore. He quit taking his medications because he could not afford it. Describes hallucinations" wavy traces ,,,, like I am tripping ,,,, nothing bad". Denies any auditory hallucinations. No tactile hallucinations. Denies any thoughts of homicide. Denies any thoughts of violence. Sleep wake cycle has been disrupted with substance use. He has not been eating well. No racing thoughts but describes marked irritability. No legal issues lately. No other stressors.  Patient stabilized over a 4 day stay on the [facility] unit. He was started on Zyprexa, Tegretol, and completed Clonidine withdrawal protocol. He was also given Vistaril PRN for anxiety and Trazodone PRN for sleep. He was seen participating in group and interacting with staff and other patients appropriately. Patient had some relationship and child issues while here which caused patient to be irritable on the unit, but patient calmed and apologized for his behavior. Patient agreed to follow up at [facility] and SW staff will be providing him the information.  He will be provided with prescriptions and 7 day supply of his medications. He denies any SI/HI/AVH and contracts for safety.   Physical Findings: AIMS: Facial and Oral Movements Muscles of Facial Expression: None, normal Lips and  Perioral Area: None, normal Jaw: None, normal Tongue: None, normal,Extremity Movements Upper (arms, wrists, hands, fingers): None, normal Lower (legs, knees, ankles, toes): None, normal, Trunk Movements Neck, shoulders, hips: None, normal, Overall Severity Severity of abnormal movements (highest score from questions above): None, normal Incapacitation due to abnormal movements: None, normal Patient's awareness of abnormal movements (rate only patient's report): No Awareness, Dental Status Current problems with teeth and/or dentures?: No Does patient usually wear dentures?: No  CIWA:  CIWA-Ar Total: 1 COWS:  COWS Total Score: 0  Musculoskeletal: Strength & Muscle Tone: within normal limits Gait & Station: normal Patient leans: N/A  Psychiatric Specialty Exam: Physical Exam  Nursing note and vitals reviewed. Constitutional: He is oriented to person, place, and time. He appears well-developed and well-nourished.  Cardiovascular: Normal rate.   Respiratory: Effort normal.  Musculoskeletal: Normal range of motion.  Neurological: He is alert and oriented to person, place, and time.  Skin: Skin is warm.    Review of Systems  Constitutional: Negative.   HENT: Negative.   Eyes: Negative.   Respiratory: Negative.   Cardiovascular: Negative.   Gastrointestinal: Negative.   Genitourinary: Negative.   Musculoskeletal: Negative.   Skin: Negative.   Neurological: Negative.   Endo/Heme/Allergies: Negative.     Blood pressure 107/70, pulse 79, temperature 98.2 F (36.8 C), temperature source Oral, resp. rate 18, height 6' (1.829 m), weight 60.3 kg (133 lb).Body mass index is 18.04 kg/m.  General Appearance: Casual  Eye Contact:  Good  Speech:  Clear and Coherent and Normal Rate  Volume:  Normal  Mood:  Euthymic  Affect:  Appropriate  Thought Process:  Coherent and Descriptions of Associations: Intact  Orientation:  Full (Time, Place, and Person)  Thought Content:  WDL  Suicidal  Thoughts:  No  Homicidal Thoughts:  No  Memory:  Immediate;   Good Recent;   Good Remote;   Good  Judgement:  Fair  Insight:  Good  Psychomotor Activity:  Normal  Concentration:  Concentration: Good and Attention Span: Good  Recall:  Good  Fund of Knowledge:  Good  Language:  Good  Akathisia:  No  Handed:  Right  AIMS (if indicated):     Assets:  Communication Skills Desire for Improvement Housing Social Support Transportation  ADL's:  Intact  Cognition:  WNL  Sleep:  Number of Hours: 5.75     Have you used any form of tobacco in the last 30 days? (Cigarettes, Smokeless Tobacco, Cigars, and/or Pipes): Yes  Has this patient used any form of tobacco in the last 30 days? (Cigarettes, Smokeless Tobacco, Cigars, and/or Pipes) Yes, Yes, A prescription for an FDA-approved tobacco cessation medication was offered at discharge and the patient refused  Blood Alcohol level:  Lab Results  Component Value Date   ETH 45 (H) 11/26/2016   ETH <5 38/93/7342    Metabolic Disorder Labs:  Lab Results  Component Value Date   HGBA1C 4.9 12/02/2016   MPG 93.93 12/02/2016   No results found for: PROLACTIN Lab Results  Component Value Date   CHOL 146 12/02/2016   TRIG 102 12/02/2016   HDL 48 12/02/2016   CHOLHDL 3.0 12/02/2016   VLDL 20 12/02/2016   LDLCALC 78 12/02/2016    See Psychiatric Specialty Exam and Suicide Risk Assessment completed by Attending Physician prior to discharge.  Discharge destination:  Home  Is patient on multiple antipsychotic therapies at discharge:  No   Has Patient had three or more failed trials of antipsychotic monotherapy by history:  No  Recommended Plan for Multiple Antipsychotic Therapies: NA   Allergies as of 12/02/2016      Reactions   Naproxen Nausea And Vomiting   Percocet [oxycodone-acetaminophen] Other (See Comments)   Acid reflux      Medication List    STOP taking these medications   diphenhydrAMINE 25 mg capsule Commonly known  as:  BENADRYL   ibuprofen 200 MG tablet Commonly known as:  ADVIL,MOTRIN   nicotine polacrilex 2 MG gum Commonly known as:  NICORETTE   zolpidem 5 MG tablet Commonly known as:  AMBIEN     TAKE these medications     Indication  carbamazepine 200 MG tablet Commonly known as:  TEGRETOL Take 1 tablet (200 mg total) by mouth 3 (three) times daily. For mood control What changed:  when to take this  additional instructions  Indication:  Sudden Involuntary Spasm of Part of the Face, mood  stability   hydrOXYzine 25 MG tablet Commonly known as:  ATARAX/VISTARIL Take 1 tablet (25 mg total) by mouth every 6 (six) hours as needed for anxiety. What changed:  medication strength  how much to take  Indication:  Feeling Anxious   OLANZapine zydis 5 MG disintegrating tablet Commonly known as:  ZYPREXA Take 1 tablet (5 mg total) by mouth at bedtime. For mood control  Indication:  mood stability   traZODone 50 MG tablet Commonly known as:  DESYREL Take 1 tablet (50 mg total) by mouth at bedtime as needed for sleep.  Indication:  Trouble Sleeping      Follow-up Information    Patient is currently refusing all follow-up appointments. Follow up.           Follow-up recommendations:  Continue activity as tolerated. Continue diet as recommended by your PCP. Ensure to keep all appointments with outpatient providers.  Comments:  Patient is instructed prior to discharge to: Take all medications as prescribed by his/her mental healthcare provider. Report any adverse effects and or reactions from the medicines to his/her outpatient provider promptly. Patient has been instructed & cautioned: To not engage in alcohol and or illegal drug use while on prescription medicines. In the event of worsening symptoms, patient is instructed to call the crisis hotline, 911 and or go to the nearest ED for appropriate evaluation and treatment of symptoms. To follow-up with his/her primary care provider  for your other medical issues, concerns and or health care needs.    Signed: Lowry Ram Money, FNP 12/02/2016, 10:16 AM   Patient seen, Suicide Assessment Completed.  Disposition Plan Reviewed

## 2016-12-02 NOTE — Progress Notes (Signed)
At the beginning of the shift, pt was at the nurse's station talking with the dayshift MHTs.  He reported to Clinical research associatewriter that he was having a good day.  He said he was waiting on his aunt to come for a visit and she was going to get him a snickers out of the vending machine.  He appeared in a good mood at that time with no complaints.  Then right before evening group time began, pt came to report he had a headache and did not feel well.  Pt was given Tylenol and Vistaril at that time and told to go rest in bed.  After group was over and MHT went to get pt's VS, she reported that pt was flush and reporting that he was hot.  He was also irritable and restless.  Pt was given his HS meds after getting his vitals taken.  He was also given Trazodone for sleep at that time.  He got our of bed shortly before 2300 stating that he was restless and could not sleep.  He wanted something else for sleep.  Writer reminded him of all the meds he had taken this evening, and encouraged him to go back to bed and relax so that the meds could work.  Pt was agreeable and cooperative.  Pt denies SI/HI/AVH.  Pt makes his needs known to staff.  Support and encouragement offered.  Discharge plans are in process.  Safety maintained with q15 minute checks.

## 2016-12-03 LAB — CARBAMAZEPINE, FREE AND TOTAL
Carbamazepine, Free: 2.3 ug/mL (ref 0.6–4.2)
Carbamazepine, Total: 9.1 ug/mL (ref 4.0–12.0)

## 2016-12-03 LAB — PROLACTIN: Prolactin: 32.5 ng/mL — ABNORMAL HIGH (ref 4.0–15.2)

## 2017-01-15 ENCOUNTER — Inpatient Hospital Stay (HOSPITAL_COMMUNITY)
Admission: EM | Admit: 2017-01-15 | Discharge: 2017-01-18 | DRG: 918 | Disposition: A | Discharge status: Other Acute Inpatient Hospital | Payer: Self-pay | Attending: Pulmonary Disease | Admitting: Pulmonary Disease

## 2017-01-15 ENCOUNTER — Encounter (HOSPITAL_COMMUNITY): Payer: Self-pay | Admitting: Emergency Medicine

## 2017-01-15 DIAGNOSIS — F319 Bipolar disorder, unspecified: Secondary | ICD-10-CM | POA: Diagnosis present

## 2017-01-15 DIAGNOSIS — T461X2A Poisoning by calcium-channel blockers, intentional self-harm, initial encounter: Principal | ICD-10-CM | POA: Diagnosis present

## 2017-01-15 DIAGNOSIS — F1190 Opioid use, unspecified, uncomplicated: Secondary | ICD-10-CM | POA: Diagnosis present

## 2017-01-15 DIAGNOSIS — Z9114 Patient's other noncompliance with medication regimen: Secondary | ICD-10-CM

## 2017-01-15 DIAGNOSIS — T43632A Poisoning by methylphenidate, intentional self-harm, initial encounter: Secondary | ICD-10-CM | POA: Diagnosis present

## 2017-01-15 DIAGNOSIS — I313 Pericardial effusion (noninflammatory): Secondary | ICD-10-CM | POA: Diagnosis present

## 2017-01-15 DIAGNOSIS — Z87442 Personal history of urinary calculi: Secondary | ICD-10-CM

## 2017-01-15 DIAGNOSIS — Z813 Family history of other psychoactive substance abuse and dependence: Secondary | ICD-10-CM

## 2017-01-15 DIAGNOSIS — T428X2A Poisoning by antiparkinsonism drugs and other central muscle-tone depressants, intentional self-harm, initial encounter: Secondary | ICD-10-CM | POA: Diagnosis present

## 2017-01-15 DIAGNOSIS — Z818 Family history of other mental and behavioral disorders: Secondary | ICD-10-CM

## 2017-01-15 DIAGNOSIS — T1491XA Suicide attempt, initial encounter: Secondary | ICD-10-CM

## 2017-01-15 DIAGNOSIS — F1910 Other psychoactive substance abuse, uncomplicated: Secondary | ICD-10-CM

## 2017-01-15 DIAGNOSIS — F17213 Nicotine dependence, cigarettes, with withdrawal: Secondary | ICD-10-CM | POA: Diagnosis present

## 2017-01-15 DIAGNOSIS — T50901A Poisoning by unspecified drugs, medicaments and biological substances, accidental (unintentional), initial encounter: Secondary | ICD-10-CM

## 2017-01-15 DIAGNOSIS — E876 Hypokalemia: Secondary | ICD-10-CM | POA: Diagnosis present

## 2017-01-15 DIAGNOSIS — T481X2A Poisoning by skeletal muscle relaxants [neuromuscular blocking agents], intentional self-harm, initial encounter: Secondary | ICD-10-CM | POA: Diagnosis present

## 2017-01-15 DIAGNOSIS — Z79899 Other long term (current) drug therapy: Secondary | ICD-10-CM

## 2017-01-15 DIAGNOSIS — N179 Acute kidney failure, unspecified: Secondary | ICD-10-CM | POA: Diagnosis present

## 2017-01-15 DIAGNOSIS — Z915 Personal history of self-harm: Secondary | ICD-10-CM

## 2017-01-15 DIAGNOSIS — F1310 Sedative, hypnotic or anxiolytic abuse, uncomplicated: Secondary | ICD-10-CM | POA: Diagnosis present

## 2017-01-15 DIAGNOSIS — R579 Shock, unspecified: Secondary | ICD-10-CM | POA: Diagnosis present

## 2017-01-15 DIAGNOSIS — T50902A Poisoning by unspecified drugs, medicaments and biological substances, intentional self-harm, initial encounter: Secondary | ICD-10-CM | POA: Diagnosis present

## 2017-01-15 DIAGNOSIS — Z888 Allergy status to other drugs, medicaments and biological substances status: Secondary | ICD-10-CM

## 2017-01-15 DIAGNOSIS — F1010 Alcohol abuse, uncomplicated: Secondary | ICD-10-CM | POA: Diagnosis present

## 2017-01-15 DIAGNOSIS — F419 Anxiety disorder, unspecified: Secondary | ICD-10-CM | POA: Diagnosis present

## 2017-01-15 DIAGNOSIS — R40241 Glasgow coma scale score 13-15, unspecified time: Secondary | ICD-10-CM | POA: Diagnosis present

## 2017-01-15 DIAGNOSIS — Z885 Allergy status to narcotic agent status: Secondary | ICD-10-CM

## 2017-01-15 DIAGNOSIS — D689 Coagulation defect, unspecified: Secondary | ICD-10-CM | POA: Diagnosis present

## 2017-01-15 LAB — COMPREHENSIVE METABOLIC PANEL
ALT: 11 U/L — AB (ref 17–63)
AST: 20 U/L (ref 15–41)
Albumin: 3.9 g/dL (ref 3.5–5.0)
Alkaline Phosphatase: 86 U/L (ref 38–126)
Anion gap: 9 (ref 5–15)
BUN: 24 mg/dL — AB (ref 6–20)
CHLORIDE: 105 mmol/L (ref 101–111)
CO2: 25 mmol/L (ref 22–32)
CREATININE: 2.77 mg/dL — AB (ref 0.61–1.24)
Calcium: 8.9 mg/dL (ref 8.9–10.3)
GFR calc Af Amer: 36 mL/min — ABNORMAL LOW (ref >60)
GFR calc non Af Amer: 31 mL/min — ABNORMAL LOW (ref >60)
GLUCOSE: 105 mg/dL — AB (ref 65–99)
Potassium: 3.6 mmol/L (ref 3.5–5.1)
SODIUM: 139 mmol/L (ref 135–145)
Total Bilirubin: 0.5 mg/dL (ref 0.3–1.2)
Total Protein: 6.8 g/dL (ref 6.5–8.1)

## 2017-01-15 LAB — RAPID URINE DRUG SCREEN, HOSP PERFORMED
AMPHETAMINES: POSITIVE — AB
Barbiturates: NOT DETECTED
Benzodiazepines: POSITIVE — AB
Cocaine: NOT DETECTED
OPIATES: POSITIVE — AB
TETRAHYDROCANNABINOL: POSITIVE — AB

## 2017-01-15 LAB — CBC WITH DIFFERENTIAL/PLATELET
BASOS PCT: 0 %
Basophils Absolute: 0 10*3/uL (ref 0.0–0.1)
EOS ABS: 0.1 10*3/uL (ref 0.0–0.7)
Eosinophils Relative: 1 %
HCT: 41.7 % (ref 39.0–52.0)
HEMOGLOBIN: 14.3 g/dL (ref 13.0–17.0)
Lymphocytes Relative: 16 %
Lymphs Abs: 1.3 10*3/uL (ref 0.7–4.0)
MCH: 30 pg (ref 26.0–34.0)
MCHC: 34.3 g/dL (ref 30.0–36.0)
MCV: 87.4 fL (ref 78.0–100.0)
MONOS PCT: 7 %
Monocytes Absolute: 0.6 10*3/uL (ref 0.1–1.0)
NEUTROS PCT: 76 %
Neutro Abs: 6.1 10*3/uL (ref 1.7–7.7)
Platelets: 282 10*3/uL (ref 150–400)
RBC: 4.77 MIL/uL (ref 4.22–5.81)
RDW: 13.3 % (ref 11.5–15.5)
WBC: 8.1 10*3/uL (ref 4.0–10.5)

## 2017-01-15 LAB — ACETAMINOPHEN LEVEL: Acetaminophen (Tylenol), Serum: <10 ug/mL — ABNORMAL LOW (ref 10–30)

## 2017-01-15 LAB — CBG MONITORING, ED: GLUCOSE-CAPILLARY: 95 mg/dL (ref 65–99)

## 2017-01-15 LAB — ETHANOL: Alcohol, Ethyl (B): <10 mg/dL (ref <10)

## 2017-01-15 LAB — SALICYLATE LEVEL

## 2017-01-15 MED ORDER — SODIUM CHLORIDE 0.9 % IV SOLN
INTRAVENOUS | Status: DC
  Administered 2017-01-16 (×2): 30 [IU]/h via INTRAVENOUS
  Filled 2017-01-15 (×3): qty 1

## 2017-01-15 MED ORDER — SODIUM CHLORIDE 0.9 % IV BOLUS (SEPSIS)
1000.0000 mL | Freq: Once | INTRAVENOUS | Status: AC
  Administered 2017-01-15: 1000 mL via INTRAVENOUS

## 2017-01-15 MED ORDER — POTASSIUM CHLORIDE 10 MEQ/100ML IV SOLN
10.0000 meq | INTRAVENOUS | Status: AC
  Administered 2017-01-16: 10 meq via INTRAVENOUS
  Filled 2017-01-15 (×3): qty 100

## 2017-01-15 MED ORDER — DEXTROSE-NACL 5-0.45 % IV SOLN
INTRAVENOUS | Status: DC
  Administered 2017-01-16 (×2): via INTRAVENOUS

## 2017-01-15 MED ORDER — DEXTROSE 5 % IV SOLN
0.0000 ug/min | Freq: Once | INTRAVENOUS | Status: AC
  Administered 2017-01-15: 2 ug/min via INTRAVENOUS
  Filled 2017-01-15: qty 4

## 2017-01-15 MED ORDER — SODIUM CHLORIDE 0.9 % IV SOLN
1.0000 g | Freq: Once | INTRAVENOUS | Status: AC
  Administered 2017-01-15: 1 g via INTRAVENOUS
  Filled 2017-01-15: qty 10

## 2017-01-15 NOTE — ED Triage Notes (Signed)
Brought in by [facility] from home with c/o suicide attempt by drug overdose.  Pt reported that he took an unknown amount of Amlodipine and Methylphenidate with intent to "not wake up".  Pt fully alert and awake on scene.  Was given NS 1000 ml bolus en route; arrived to ED awake and A/Ox4 without s/s apparent distress noted.

## 2017-01-15 NOTE — ED Notes (Signed)
Pt reported that he took the unprescribed medications at approximately 8 to 10 hours ago.

## 2017-01-15 NOTE — ED Notes (Signed)
Bed: [id number] Expected date:  Expected time:  Means of arrival:  Comments: EMS: OD/Suicidal

## 2017-01-15 NOTE — ED Provider Notes (Signed)
[facility]  CSN: [id number] Arrival date & time: 01/15/17 2028  Chief Complaint(s) Suicidal and Drug Overdose  HPI Dustin Owens is a 21 y.o. male   HPI  21 year old male with a history of bipolar disease, substance abuse presents to the emergency department after a suicide attempt.  He reports that he went on a methamphetamine and heroin binge 2 days ago.  Today he tried to commit suicide by taking several medications.  Reports that he took 80-90 pills of 5 mg amlodipine over 45-hour period, handful of Flexeril, 5 or 6 pills of Soma, 5 or 6 pills of Concerta.  He reports that he take this anywhere from 6-12 hours ago.  States that he fell asleep afterwards.  Reports that his mother called EMS to have him brought in for evaluation.  Patient does endorse that this was a suicide attempt because he is "tired of living with all the stress of life."  Patient denies any physical complaints at this time.  Denies any homicidal ideations, auditory/visual hallucinations.  Reports prior suicide attempts over the past several years.  Past Medical History Past Medical History:  Diagnosis Date  . Allergy   . Anxiety   . Depression   . Headache(784.0)   . Vision abnormalities    Patient Active Problem List   Diagnosis Date Noted  . Substance induced mood disorder ([facility]) 11/28/2016  . Bipolar 1 disorder ([facility]) 11/27/2016  . Bipolar affective disorder, depressed, severe ([facility]) 04/14/2016  . Suicide attempt by drug ingestion ([facility])   . Cocaine abuse ([facility]) 04/12/2016  . 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 Conduct disorder, childhood onset type 07/01/2011  . Polysubstance dependence ([facility]) 07/01/2011   Home Medication(s) Prior to Admission medications   Medication Sig Start Date End Date Taking? Authorizing Provider  carbamazepine (TEGRETOL) 200 MG tablet Take 1 tablet (200 mg total) by mouth 3 (three) times  daily. For mood control Patient not taking: Reported on 01/15/2017 12/02/16   Money, Gerlene Burdockravis B, FNP  hydrOXYzine (ATARAX/VISTARIL) 25 MG tablet Take 1 tablet (25 mg total) by mouth every 6 (six) hours as needed for anxiety. Patient not taking: Reported on 01/15/2017 12/02/16   Money, Gerlene Burdockravis B, FNP  OLANZapine zydis (ZYPREXA) 5 MG disintegrating tablet Take 1 tablet (5 mg total) by mouth at bedtime. For mood control Patient not taking: Reported on 01/15/2017 12/02/16   Money, Gerlene Burdockravis B, FNP  traZODone (DESYREL) 50 MG tablet Take 1 tablet (50 mg total) by mouth at bedtime as needed for sleep. Patient not taking: Reported on 01/15/2017 12/02/16   Money, Gerlene Burdockravis B, FNP                                                                                                                                    Past Surgical History Past Surgical History:  Procedure Laterality Date  . NO PAST SURGERIES  Family History Family History  Problem Relation Age of Onset  . OCD Mother   . Drug abuse Mother   . Schizophrenia Father   . Drug abuse Father     Social History Social History  Substance Use Topics  . Smoking status: Current Every Day Smoker    Packs/day: 1.00    Years: 5.00    Types: Cigarettes  . Smokeless tobacco: Never Used  . Alcohol use 0.5 oz/week    1 Standard drinks or equivalent per week     Comment: 1 - 1/2 5ths of bourbon   Allergies Naproxen and Percocet [oxycodone-acetaminophen]  Review of Systems Review of Systems All other systems are reviewed and are negative for acute change except as noted in the HPI  Physical Exam Vital Signs  I have reviewed the triage vital signs BP (!) 82/46   Pulse 77   Temp (!) 97.5 F (36.4 C) (Oral)   Resp 18   Ht 6' (1.829 m)   Wt 60.3 kg (133 lb)   SpO2 100%   BMI 18.04 kg/m   Physical Exam  Constitutional: He is oriented to person, place, and time. He appears well-developed and well-nourished. No distress.  HENT:  Head: Normocephalic  and atraumatic.  Nose: Nose normal.  Eyes: Pupils are equal, round, and reactive to light. Conjunctivae and EOM are normal. Right eye exhibits no discharge. Left eye exhibits no discharge. No scleral icterus.  Neck: Normal range of motion. Neck supple.  Cardiovascular: Normal rate and regular rhythm.  Exam reveals no gallop and no friction rub.   No murmur heard. Pulmonary/Chest: Effort normal and breath sounds normal. No stridor. No respiratory distress. He has no rales.  Abdominal: Soft. He exhibits no distension. There is no tenderness.  Musculoskeletal: He exhibits no edema or tenderness.  Neurological: He is alert and oriented to person, place, and time.  Skin: Skin is warm and dry. No rash noted. He is not diaphoretic. No erythema.  Psychiatric: He has a normal mood and affect.  Vitals reviewed.   ED Results and Treatments Labs (all labs ordered are listed, but only abnormal results are displayed) Labs Reviewed  COMPREHENSIVE METABOLIC PANEL - Abnormal; Notable for the following:       Result Value   Glucose, Bld 105 (*)    BUN 24 (*)    Creatinine, Ser 2.77 (*)    ALT 11 (*)    GFR calc non Af Amer 31 (*)    GFR calc Af Amer 36 (*)    All other components within normal limits  RAPID URINE DRUG SCREEN, HOSP PERFORMED - Abnormal; Notable for the following:    Opiates POSITIVE (*)    Benzodiazepines POSITIVE (*)    Amphetamines POSITIVE (*)    Tetrahydrocannabinol POSITIVE (*)    All other components within normal limits  ACETAMINOPHEN LEVEL - Abnormal; Notable for the following:    Acetaminophen (Tylenol), Serum <10 (*)    All other components within normal limits  ETHANOL  CBC WITH DIFFERENTIAL/PLATELET  SALICYLATE LEVEL  BASIC METABOLIC PANEL  CBG MONITORING, ED  EKG  EKG Interpretation  Date/Time:  Friday January 15 2017 20:39:35 EDT Ventricular  Rate:  92 PR Interval:    QRS Duration: 95 QT Interval:  354 QTC Calculation: 438 R Axis:   69 Text Interpretation:  Sinus rhythm RSR' in V1 or V2, probably normal variant Nonspecific T abnormalities, lateral leads QT has shortened when compared to prior Otherwise no significant change Confirmed by Drema Pry [phone]) on 01/15/2017 9:00:33 PM      Radiology No results found. Pertinent labs & imaging results that were available during my care of the patient were reviewed by me and considered in my medical decision making (see chart for details).  Medications Ordered in ED Medications  calcium gluconate 1 g in sodium chloride 0.9 % 100 mL IVPB (1 g Intravenous New Bag/Given 01/15/17 2340)  norepinephrine (LEVOPHED) 4 mg in dextrose 5 % 250 mL (0.016 mg/mL) infusion (not administered)  potassium chloride 10 mEq in 100 mL IVPB (not administered)  dextrose 5 %-0.45 % sodium chloride infusion (not administered)  insulin regular (NOVOLIN R,HUMULIN R) 100 Units in sodium chloride 0.9 % 100 mL (1 Units/mL) infusion (not administered)  sodium chloride 0.9 % bolus 1,000 mL (1,000 mLs Intravenous New Bag/Given 01/15/17 2142)                                                                                                                                    Procedures Procedures CRITICAL CARE Performed by: Amadeo Garnet Cardama Total critical care time: 60 minutes Critical care time was exclusive of separately billable procedures and treating other patients. Critical care was necessary to treat or prevent imminent or life-threatening deterioration. Critical care was time spent personally by me on the following activities: development of treatment plan with patient and/or surrogate as well as nursing, discussions with consultants, evaluation of patient's response to treatment, examination of patient, obtaining history from patient or surrogate, ordering and performing treatments and interventions,  ordering and review of laboratory studies, ordering and review of radiographic studies, pulse oximetry and re-evaluation of patient's condition.   (including critical care time)  Medical Decision Making / ED Course I have reviewed the nursing notes for this encounter and the patient's prior records (if available in EHR or on provided paperwork).    Discussed case with poison control who recommended above workup.  Also recommended 12-hour observation.  Treatment of hypertension with IV fluids.  If hypotension is refractory to IV fluids, pressors are indicated.   Coingestion labs obtained as above.  Patient was IVCd.  We will monitor closely.  Labs revealed acute renal insufficiency.  Acetaminophen and salicylate levels within normal limits.  UDS positive for opiates, benzos, amphetamines, THC.  Vitals:   01/15/17 2039 01/15/17 2305  BP: (!) 93/47 (!) 82/46  Pulse: 92 77  Resp: 18 18  Temp: (!) 97.5 F (36.4 C)   TempSrc: Oral   SpO2: 98% 100%  Weight: 60.3  kg (133 lb)   Height: 6' (1.829 m)     BP not responding to IVF. Pt given Ca+, Insulin w/ glucose, and started on Levophed.  Will discuss with ICU for admission.  Final Clinical Impression(s) / ED Diagnoses Final diagnoses:  Suicide attempt [facility])  Polysubstance abuse (HCC)      This chart was dictated using voice recognition software.  Despite best efforts to proofread,  errors can occur which can change the documentation meaning.   Nira Conn, MD 01/15/17 [phone]

## 2017-01-15 NOTE — ED Notes (Signed)
Pt's contact: Dustin Owens (mother)---- tel# [phone]

## 2017-01-16 ENCOUNTER — Inpatient Hospital Stay (HOSPITAL_COMMUNITY): Payer: Self-pay

## 2017-01-16 DIAGNOSIS — F17210 Nicotine dependence, cigarettes, uncomplicated: Secondary | ICD-10-CM

## 2017-01-16 DIAGNOSIS — G9340 Encephalopathy, unspecified: Secondary | ICD-10-CM

## 2017-01-16 DIAGNOSIS — R579 Shock, unspecified: Secondary | ICD-10-CM

## 2017-01-16 DIAGNOSIS — T461X2A Poisoning by calcium-channel blockers, intentional self-harm, initial encounter: Principal | ICD-10-CM

## 2017-01-16 DIAGNOSIS — F909 Attention-deficit hyperactivity disorder, unspecified type: Secondary | ICD-10-CM

## 2017-01-16 DIAGNOSIS — Z630 Problems in relationship with spouse or partner: Secondary | ICD-10-CM

## 2017-01-16 DIAGNOSIS — F332 Major depressive disorder, recurrent severe without psychotic features: Secondary | ICD-10-CM

## 2017-01-16 DIAGNOSIS — T43632A Poisoning by methylphenidate, intentional self-harm, initial encounter: Secondary | ICD-10-CM

## 2017-01-16 DIAGNOSIS — Z818 Family history of other mental and behavioral disorders: Secondary | ICD-10-CM

## 2017-01-16 DIAGNOSIS — T50902A Poisoning by unspecified drugs, medicaments and biological substances, intentional self-harm, initial encounter: Secondary | ICD-10-CM

## 2017-01-16 DIAGNOSIS — F1010 Alcohol abuse, uncomplicated: Secondary | ICD-10-CM

## 2017-01-16 DIAGNOSIS — F419 Anxiety disorder, unspecified: Secondary | ICD-10-CM

## 2017-01-16 DIAGNOSIS — F1920 Other psychoactive substance dependence, uncomplicated: Secondary | ICD-10-CM

## 2017-01-16 DIAGNOSIS — T481X2A Poisoning by skeletal muscle relaxants [neuromuscular blocking agents], intentional self-harm, initial encounter: Secondary | ICD-10-CM

## 2017-01-16 LAB — MAGNESIUM: Magnesium: 1.6 mg/dL — ABNORMAL LOW (ref 1.7–2.4)

## 2017-01-16 LAB — GLUCOSE, CAPILLARY
GLUCOSE-CAPILLARY: 88 mg/dL (ref 65–99)
Glucose-Capillary: 101 mg/dL — ABNORMAL HIGH (ref 65–99)
Glucose-Capillary: 78 mg/dL (ref 65–99)

## 2017-01-16 LAB — CBC
HEMATOCRIT: 40.2 % (ref 39.0–52.0)
HEMOGLOBIN: 14 g/dL (ref 13.0–17.0)
MCH: 29.9 pg (ref 26.0–34.0)
MCHC: 34.8 g/dL (ref 30.0–36.0)
MCV: 85.7 fL (ref 78.0–100.0)
Platelets: 293 10*3/uL (ref 150–400)
RBC: 4.69 MIL/uL (ref 4.22–5.81)
RDW: 12.9 % (ref 11.5–15.5)
WBC: 16.7 10*3/uL — AB (ref 4.0–10.5)

## 2017-01-16 LAB — PROTIME-INR
INR: 1.23
PROTHROMBIN TIME: 15.4 s — AB (ref 11.4–15.2)

## 2017-01-16 LAB — BASIC METABOLIC PANEL
ANION GAP: 7 (ref 5–15)
BUN: 21 mg/dL — AB (ref 6–20)
CALCIUM: 8.5 mg/dL — AB (ref 8.9–10.3)
CO2: 19 mmol/L — AB (ref 22–32)
CREATININE: 1.41 mg/dL — AB (ref 0.61–1.24)
Chloride: 111 mmol/L (ref 101–111)
Glucose, Bld: 94 mg/dL (ref 65–99)
POTASSIUM: 3.8 mmol/L (ref 3.5–5.1)
Sodium: 137 mmol/L (ref 135–145)

## 2017-01-16 LAB — PHOSPHORUS: PHOSPHORUS: 2.3 mg/dL — AB (ref 2.5–4.6)

## 2017-01-16 LAB — MRSA PCR SCREENING: MRSA BY PCR: NEGATIVE

## 2017-01-16 LAB — HIV ANTIBODY (ROUTINE TESTING W REFLEX): HIV Screen 4th Generation wRfx: NONREACTIVE

## 2017-01-16 LAB — APTT: aPTT: 32 seconds (ref 24–36)

## 2017-01-16 MED ORDER — GLUCAGON HCL RDNA (DIAGNOSTIC) 1 MG IJ SOLR
1.0000 mg/h | INTRAMUSCULAR | Status: DC
  Administered 2017-01-16 (×2): 1 mg/h via INTRAVENOUS
  Filled 2017-01-16 (×2): qty 5

## 2017-01-16 MED ORDER — NOREPINEPHRINE BITARTRATE 1 MG/ML IV SOLN
0.0000 ug/min | INTRAVENOUS | Status: DC
  Administered 2017-01-16: 10 ug/min via INTRAVENOUS
  Administered 2017-01-16: 8 ug/min via INTRAVENOUS
  Administered 2017-01-16: 7 ug/min via INTRAVENOUS
  Administered 2017-01-17: 10 ug/min via INTRAVENOUS
  Filled 2017-01-16 (×3): qty 4

## 2017-01-16 MED ORDER — ONDANSETRON HCL 4 MG/2ML IJ SOLN
4.0000 mg | Freq: Four times a day (QID) | INTRAMUSCULAR | Status: DC | PRN
  Administered 2017-01-16: 4 mg via INTRAVENOUS
  Filled 2017-01-16: qty 2

## 2017-01-16 MED ORDER — POTASSIUM CHLORIDE 10 MEQ/100ML IV SOLN
10.0000 meq | INTRAVENOUS | Status: AC
  Administered 2017-01-16 (×2): 10 meq via INTRAVENOUS

## 2017-01-16 MED ORDER — SODIUM CHLORIDE 0.9 % IV SOLN: 250.0000 mL | INTRAVENOUS | Status: DC | PRN

## 2017-01-16 MED ORDER — FOLIC ACID 1 MG PO TABS
1.0000 mg | ORAL_TABLET | Freq: Every day | ORAL | Status: DC
  Administered 2017-01-16 – 2017-01-18 (×3): 1 mg via ORAL
  Filled 2017-01-16 (×3): qty 1

## 2017-01-16 MED ORDER — ADULT MULTIVITAMIN W/MINERALS CH
1.0000 | ORAL_TABLET | Freq: Every day | ORAL | Status: DC
  Administered 2017-01-16 – 2017-01-18 (×3): 1 via ORAL
  Filled 2017-01-16 (×3): qty 1

## 2017-01-16 MED ORDER — HEPARIN SODIUM (PORCINE) 5000 UNIT/ML IJ SOLN
5000.0000 [IU] | Freq: Three times a day (TID) | INTRAMUSCULAR | Status: DC
  Administered 2017-01-16 – 2017-01-17 (×4): 5000 [IU] via SUBCUTANEOUS
  Filled 2017-01-16 (×5): qty 1

## 2017-01-16 MED ORDER — VITAMIN B-1 100 MG PO TABS
100.0000 mg | ORAL_TABLET | Freq: Every day | ORAL | Status: DC
  Administered 2017-01-16 – 2017-01-18 (×3): 100 mg via ORAL
  Filled 2017-01-16 (×3): qty 1

## 2017-01-16 NOTE — Consult Note (Signed)
[place] [facility] Consult   Reason for Consult:  Intentional polydrug overdose and substance abuse Referring Physician:  Dr. Nelda Marseille Patient Identification: Dustin Owens MRN:  [id number] Principal Diagnosis: <principal problem not specified> Diagnosis:   Patient Active Problem List   Diagnosis Date Noted  . Overdose, drug, intentional self-harm, initial encounter ([place]) [T50.902A] 01/16/2017  . Substance induced mood disorder ([place]) [F19.94] 11/28/2016  . Bipolar 1 disorder ([place]) [F31.9] 11/27/2016  . Bipolar affective disorder, depressed, severe ([place]) [F31.4] 04/14/2016  . Suicide attempt by drug ingestion ([place]) [T50.902A]   . Cocaine abuse ([place]) [F14.10] 04/12/2016  . Severe recurrent major depression with psychotic features ([place]) [F33.3] 07/01/2011  . 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 Polysubstance dependence ([place]) [F19.20] 07/01/2011    Total Time spent with patient: 1 hour  Subjective:   Dustin Owens is a 21 y.o. male patient admitted with intentional overdose as a suicide attempt.  HPI: Dustin Owens a 21 years old male admitted to [facility] for status post suicidal attempt by taking multiple prescription medications to end his life. Patient reported he has tried to previous suicidal attempts without successful. Patient also reported he was previously admitted to behavioral [place] and reportedly noncompliant with the medication management after discharge from the hospital. Patient also endorses symptoms of depression, anxiety, ADHD and 5-6 years history of hard core substance abuse including cocaine and heroin. Patient stated he is worried about breaking up with his girlfriend of 5 years which is making more depressed and suicidal. According to the patient mother patient has been plan this suicide attempt for sometime by mouth and it is a suicide attempt and worried about his life. Patient urine drug screen is positive for  opiates, benzo's amphetamines and tetrahdrocannabinol. Patieent blood alcohol level is less than 10  Medical history: 21 year old male with PMH of Anxiety, Depression, Headache , 2 prior suicidal attempts presents to ED on 10/19 after being found at home with SI by drug overdose. Patient reports that he took 80-90 (97m) amlodipine, unknown about of flexeril, 5-6 Soma, 5-6 Concerta about 68-34hours ago. However for the last two days patient went on a meth and heroin binge. He also states that he drank 3-4 cases of beer within the last 3-4 days. Patient reports that he was tired of living with all the stress of life. He recently broke up with his high school girlfriend who he had a 5 yr relationship with. He has 2 young children.  His Mother expressed that the patient really tried to kill himself (he disconnected the battery in her car so she would not be able to drive him and had been planning this for some time now because she found empty pill bottles in a 1 gallon ziploc under his trunk) She states the following med bottles were found empty: Terbinafine metadate (gteneric Ritalin), lisinopril, amylodipine x 3, meloxicam 7.568mx 2, hydrocodone 2013, buspar, Metronidazole, and clindamyicin. Initial BP 80/43--> improved to 92 sys.  Placed on low dose levophed. PCCM asked to admit.    Past Psychiatric History: Bipolar disorder, polysubstance abuse and history of past suicide attempts.  Risk to Self: Is patient at risk for suicide?: Yes Risk to Others:   Prior Inpatient Therapy:   Prior Outpatient Therapy:    Past Medical History:  Past Medical History:  Diagnosis Date  . Allergy   . Anxiety   . Depression   . Headache(784.0)   . Vision abnormalities  Past Surgical History:  Procedure Laterality Date  . NO PAST SURGERIES     Family History:  Family History  Problem Relation Age of Onset  . OCD Mother   . Drug abuse Mother   . Schizophrenia Father   . Drug abuse Father    Family  Psychiatric  History: Pt reports a family history of suicide and substance abuse.  Social History:  History  Alcohol Use  . 0.5 oz/week  . 1 Standard drinks or equivalent per week    Comment: 1 - 1/2 5ths of bourbon     History  Drug Use  . Frequency: 7.0 times per week  . Types: Amphetamines, Benzodiazepines, Cocaine, Codeine, Fentanyl, Hashish, Heroin, Hydrocodone, Hydromorphone, LSD, Marijuana, MDMA (Ecstacy), Methamphetamines, Morphine, Nitrous oxide, Opium, PCP, Solvent inhalants    Social History   Social History  . Marital status: Single    Spouse name: N/A  . Number of children: N/A  . Years of education: N/A   Occupational History  . student     11th grade at a [employer] facility   Social History Main Topics  . Smoking status: Current Every Day Smoker    Packs/day: 1.00    Years: 5.00    Types: Cigarettes  . Smokeless tobacco: Never Used  . Alcohol use 0.5 oz/week    1 Standard drinks or equivalent per week     Comment: 1 - 1/2 5ths of bourbon  . Drug use: Yes    Frequency: 7.0 times per week    Types: Amphetamines, Benzodiazepines, Cocaine, Codeine, Fentanyl, Hashish, Heroin, Hydrocodone, Hydromorphone, LSD, Marijuana, MDMA (Ecstacy), Methamphetamines, Morphine, Nitrous oxide, Opium, PCP, Solvent inhalants  . Sexual activity: Yes    Partners: Female    Birth control/ protection: Condom   Other Topics Concern  . None   Social History Narrative  . None   Additional Social History:    Allergies:   Allergies  Allergen Reactions  . Naproxen Nausea And Vomiting  . Percocet [Oxycodone-Acetaminophen] Other (See Comments)    Acid reflux    Labs:  Results for orders placed or performed during the hospital encounter of 01/15/17 (from the past 48 hour(s))  Comprehensive metabolic panel     Status: Abnormal   Collection Time: 01/15/17  8:59 PM  Result Value Ref Range   Sodium 139 135 - 145 mmol/L   Potassium 3.6 3.5 - 5.1 mmol/L   Chloride 105 101 - 111  mmol/L   CO2 25 22 - 32 mmol/L   Glucose, Bld 105 (H) 65 - 99 mg/dL   BUN 24 (H) 6 - 20 mg/dL   Creatinine, Ser 2.77 (H) 0.61 - 1.24 mg/dL   Calcium 8.9 8.9 - 10.3 mg/dL   Total Protein 6.8 6.5 - 8.1 g/dL   Albumin 3.9 3.5 - 5.0 g/dL   AST 20 15 - 41 U/L   ALT 11 (L) 17 - 63 U/L   Alkaline Phosphatase 86 38 - 126 U/L   Total Bilirubin 0.5 0.3 - 1.2 mg/dL   GFR calc non Af Amer 31 (L) >60 mL/min   GFR calc Af Amer 36 (L) >60 mL/min    Comment: (NOTE) The eGFR has been calculated using the CKD EPI equation. This calculation has not been validated in all clinical situations. eGFR's persistently <60 mL/min signify possible Chronic Kidney Disease.    Anion gap 9 5 - 15  CBC with Diff     Status: None   Collection Time: 01/15/17  8:59 PM  Result Value Ref Range   WBC 8.1 4.0 - 10.5 K/uL   RBC 4.77 4.22 - 5.81 MIL/uL   Hemoglobin 14.3 13.0 - 17.0 g/dL   HCT 41.7 39.0 - 52.0 %   MCV 87.4 78.0 - 100.0 fL   MCH 30.0 26.0 - 34.0 pg   MCHC 34.3 30.0 - 36.0 g/dL   RDW 13.3 11.5 - 15.5 %   Platelets 282 150 - 400 K/uL   Neutrophils Relative % 76 %   Neutro Abs 6.1 1.7 - 7.7 K/uL   Lymphocytes Relative 16 %   Lymphs Abs 1.3 0.7 - 4.0 K/uL   Monocytes Relative 7 %   Monocytes Absolute 0.6 0.1 - 1.0 K/uL   Eosinophils Relative 1 %   Eosinophils Absolute 0.1 0.0 - 0.7 K/uL   Basophils Relative 0 %   Basophils Absolute 0.0 0.0 - 0.1 K/uL  Ethanol     Status: None   Collection Time: 01/15/17  9:00 PM  Result Value Ref Range   Alcohol, Ethyl (B) <10 <10 mg/dL    Comment:        LOWEST DETECTABLE LIMIT FOR SERUM ALCOHOL IS 10 mg/dL FOR MEDICAL PURPOSES ONLY   Salicylate level     Status: None   Collection Time: 01/15/17  9:00 PM  Result Value Ref Range   Salicylate Lvl <0.2 2.8 - 30.0 mg/dL  Acetaminophen level     Status: Abnormal   Collection Time: 01/15/17  9:00 PM  Result Value Ref Range   Acetaminophen (Tylenol), Serum <10 (L) 10 - 30 ug/mL    Comment:        THERAPEUTIC  CONCENTRATIONS VARY SIGNIFICANTLY. A RANGE OF 10-30 ug/mL MAY BE AN EFFECTIVE CONCENTRATION FOR MANY PATIENTS. HOWEVER, SOME ARE BEST TREATED AT CONCENTRATIONS OUTSIDE THIS RANGE. ACETAMINOPHEN CONCENTRATIONS >150 ug/mL AT 4 HOURS AFTER INGESTION AND >50 ug/mL AT 12 HOURS AFTER INGESTION ARE OFTEN ASSOCIATED WITH TOXIC REACTIONS.   Urine rapid drug screen (hosp performed)     Status: Abnormal   Collection Time: 01/15/17  9:01 PM  Result Value Ref Range   Opiates POSITIVE (A) NONE DETECTED   Cocaine NONE DETECTED NONE DETECTED   Benzodiazepines POSITIVE (A) NONE DETECTED   Amphetamines POSITIVE (A) NONE DETECTED   Tetrahydrocannabinol POSITIVE (A) NONE DETECTED   Barbiturates NONE DETECTED NONE DETECTED    Comment:        DRUG SCREEN FOR MEDICAL PURPOSES ONLY.  IF CONFIRMATION IS NEEDED FOR ANY PURPOSE, NOTIFY LAB WITHIN 5 DAYS.        LOWEST DETECTABLE LIMITS FOR URINE DRUG SCREEN Drug Class       Cutoff (ng/mL) Amphetamine      1000 Barbiturate      200 Benzodiazepine   111 Tricyclics       552 Opiates          300 Cocaine          300 THC              50   CBG monitoring, ED     Status: None   Collection Time: 01/15/17  9:06 PM  Result Value Ref Range   Glucose-Capillary 95 65 - 99 mg/dL  MRSA PCR Screening     Status: None   Collection Time: 01/16/17  1:36 AM  Result Value Ref Range   MRSA by PCR NEGATIVE NEGATIVE    Comment:        The GeneXpert MRSA Assay (FDA approved for NASAL specimens  only), is one component of a comprehensive MRSA colonization surveillance program. It is not intended to diagnose MRSA infection nor to guide or monitor treatment for MRSA infections.   Glucose, capillary     Status: Abnormal   Collection Time: 01/16/17  3:49 AM  Result Value Ref Range   Glucose-Capillary 101 (H) 65 - 99 mg/dL  Basic metabolic panel     Status: Abnormal   Collection Time: 01/16/17  6:33 AM  Result Value Ref Range   Sodium 137 135 - 145 mmol/L    Potassium 3.8 3.5 - 5.1 mmol/L   Chloride 111 101 - 111 mmol/L   CO2 19 (L) 22 - 32 mmol/L   Glucose, Bld 94 65 - 99 mg/dL   BUN 21 (H) 6 - 20 mg/dL   Creatinine, Ser 1.41 (H) 0.61 - 1.24 mg/dL    Comment: DELTA CHECK NOTED   Calcium 8.5 (L) 8.9 - 10.3 mg/dL   GFR calc non Af Amer >60 >60 mL/min   GFR calc Af Amer >60 >60 mL/min    Comment: (NOTE) The eGFR has been calculated using the CKD EPI equation. This calculation has not been validated in all clinical situations. eGFR's persistently <60 mL/min signify possible Chronic Kidney Disease.    Anion gap 7 5 - 15  CBC     Status: Abnormal   Collection Time: 01/16/17  6:33 AM  Result Value Ref Range   WBC 16.7 (H) 4.0 - 10.5 K/uL   RBC 4.69 4.22 - 5.81 MIL/uL   Hemoglobin 14.0 13.0 - 17.0 g/dL   HCT 40.2 39.0 - 52.0 %   MCV 85.7 78.0 - 100.0 fL   MCH 29.9 26.0 - 34.0 pg   MCHC 34.8 30.0 - 36.0 g/dL   RDW 12.9 11.5 - 15.5 %   Platelets 293 150 - 400 K/uL  Magnesium     Status: Abnormal   Collection Time: 01/16/17  6:33 AM  Result Value Ref Range   Magnesium 1.6 (L) 1.7 - 2.4 mg/dL  Phosphorus     Status: Abnormal   Collection Time: 01/16/17  6:33 AM  Result Value Ref Range   Phosphorus 2.3 (L) 2.5 - 4.6 mg/dL  Protime-INR     Status: Abnormal   Collection Time: 01/16/17  6:33 AM  Result Value Ref Range   Prothrombin Time 15.4 (H) 11.4 - 15.2 seconds   INR 1.23   APTT     Status: None   Collection Time: 01/16/17  6:33 AM  Result Value Ref Range   aPTT 32 24 - 36 seconds  Glucose, capillary     Status: None   Collection Time: 01/16/17  6:36 AM  Result Value Ref Range   Glucose-Capillary 88 65 - 99 mg/dL  Glucose, capillary     Status: None   Collection Time: 01/16/17  7:58 AM  Result Value Ref Range   Glucose-Capillary 78 65 - 99 mg/dL    Current Facility-Administered Medications  Medication Dose Route Frequency Provider Last Rate Last Dose  . 0.9 %  sodium chloride infusion  250 mL Intravenous PRN Scatliffe,  Kristen D, MD      . dextrose 5 %-0.45 % sodium chloride infusion   Intravenous Continuous Cardama, Grayce Sessions, MD 125 mL/hr at 01/16/17 0810    . folic acid (FOLVITE) tablet 1 mg  1 mg Oral Daily Scatliffe, Kristen D, MD      . glucagon (human recombinant) (GLUCAGEN) 5 mg in dextrose 5 % 50 mL (0.1  mg/mL) infusion  1 mg/hr Intravenous Continuous Scatliffe, Kristen D, MD 10 mL/hr at 01/16/17 0540 1 mg/hr at 01/16/17 0540  . heparin injection 5,000 Units  5,000 Units Subcutaneous Q8H Scatliffe, Rise Paganini, MD   5,000 Units at 01/16/17 0600  . insulin regular (NOVOLIN R,HUMULIN R) 100 Units in sodium chloride 0.9 % 100 mL (1 Units/mL) infusion   Intravenous Continuous Cardama, Grayce Sessions, MD 30 mL/hr at 01/16/17 0800    . multivitamin with minerals tablet 1 tablet  1 tablet Oral Daily Scatliffe, Kristen D, MD      . norepinephrine (LEVOPHED) 4 mg in dextrose 5 % 250 mL (0.016 mg/mL) infusion  0-10 mcg/min Intravenous Continuous Omar Person, NP 30 mL/hr at 01/16/17 0810 8 mcg/min at 01/16/17 0810  . ondansetron (ZOFRAN) injection 4 mg  4 mg Intravenous Q6H PRN Scatliffe, Kristen D, MD      . thiamine (VITAMIN B-1) tablet 100 mg  100 mg Oral Daily Scatliffe, Kristen D, MD        Musculoskeletal: Strength & Muscle Tone: within normal limits Gait & Station: unable to stand Patient leans: N/A  Psychiatric Specialty Exam: Physical Exam as per historr and physical  ROS increase her depression, anxiety difficulty with sleep, appetite and worried about breaking up with his girlfriend and they increased her binge drug abuse and endorses suicidal attempt.  Blood pressure (!) 113/46, pulse 96, temperature 98.5 F (36.9 C), temperature source Oral, resp. rate 18, height 6' (1.829 m), weight 66.6 kg (146 lb 13.2 oz), SpO2 95 %.Body mass index is 19.91 kg/m.  General Appearance: Guarded  Eye Contact:  Good  Speech:  Clear and Coherent  Volume:  Decreased  Mood:  Anxious, Depressed, Hopeless  and Worthless  Affect:  Constricted and Depressed  Thought Process:  Coherent and Goal Directed  Orientation:  Full (Time, Place, and Person)  Thought Content:  Rumination  Suicidal Thoughts:  Yes.  with intent/plan  Homicidal Thoughts:  No  Memory:  Immediate;   Fair Recent;   Fair Remote;   Fair  Judgement:  Impaired  Insight:  Fair  Psychomotor Activity:  Decreased  Concentration:  Concentration: Good and Attention Span: Good  Recall:  Good  Fund of Knowledge:  Good  Language:  Good  Akathisia:  Negative  Handed:  Right  AIMS (if indicated):     Assets:  Communication Skills Desire for Improvement Financial Resources/Insurance Housing Leisure Time Physical Health Resilience Social Support Talents/Skills Transportation  ADL's:  Intact  Cognition:  WNL  Sleep:        Treatment Plan Summary: 21 years old male with a history of depression, anxiety, polysubstance abuse admitted with status post suicidal attempt. Patient has 3 previous acute psychiatric hospitalization and his recent hospitalization was in 11/27/2016.   Recommendation: Continue safety stress patient cannot contract for safety at this time Status post suicidal attempt with multiple drug overdose No psychotropic medication until medically cleared Refer to the unit CSW for appropriate psychiatric placement needs Daily contact with patient to assess and evaluate symptoms and progress in treatment and Medication management  Disposition: Recommend psychiatric Inpatient admission when medically cleared. Supportive therapy provided about ongoing stressors.  Ambrose Finland, MD 01/16/2017 8:37 AM

## 2017-01-16 NOTE — H&P (Signed)
.. ..  Name: Dustin Owens MRN: [id number] DOB: 27-Apr-1995    ADMISSION DATE:  01/15/2017 CONSULTATION DATE:  01/15/2017  REFERRING MD :  Cynda Acres ATTENDING CHIEF COMPLAINT: SUICIDE ATTEMPT- POLYPHARMACY  BRIEF PATIENT DESCRIPTION:  21 yr old PMHx of Anxiety, Depression, Headache, and 2 prior suicidal attempts presents from home after Mom found him at home took multiple pills has a h/o heroin use, meth and ETOH use. He expressed clearly that this was an attempt to end his life.   SIGNIFICANT EVENTS   STUDIES:  EKG  HISTORY OF PRESENT ILLNESS:  21 year old male with PMH of Anxiety, Depression, Headache , 2 prior suicidal attempts presents to ED on 10/19 after being found at home with SI by drug overdose. Patient reports that he took 80-90 (5mg ) amlodipine, unknown about of flexeril, 5-6 Soma, 5-6 Concerta about 6-10 hours ago. However for the last two days patient went on a meth and heroin binge. He also states that he drank 3-4 cases of beer within the last 3-4 days. Patient reports that he was tired of living with all the stress of life. He recently broke up with his high school girlfriend who he had a 5 yr relationship with. He has 2 young children.  His Mother expressed that the patient really tried to kill himself (he disconnected the battery in her car so she would not be able to drive him and had been planning this for some time now because she found empty pill bottles in a 1 gallon ziploc under his trunk) She states the following med bottles were found empty: Terbinafine metadate (gteneric Ritalin), lisinopril, amylodipine x 3, meloxicam 7.5mg  x 2, hydrocodone 2013, buspar, Metronidazole, and clindamyicin. Initial BP 80/43--> improved to 92 sys.  Placed on low dose levophed. PCCM asked to admit.   PAST MEDICAL HISTORY :   has a past medical history of Allergy; Anxiety; Depression; Headache(784.0); and Vision abnormalities.  has a past surgical history that includes No past  surgeries. Prior to Admission medications   Medication Sig Start Date End Date Taking? Authorizing Provider  carbamazepine (TEGRETOL) 200 MG tablet Take 1 tablet (200 mg total) by mouth 3 (three) times daily. For mood control Patient not taking: Reported on 01/15/2017 12/02/16   Money, Gerlene Burdock, FNP  hydrOXYzine (ATARAX/VISTARIL) 25 MG tablet Take 1 tablet (25 mg total) by mouth every 6 (six) hours as needed for anxiety. Patient not taking: Reported on 01/15/2017 12/02/16   Money, Gerlene Burdock, FNP  OLANZapine zydis (ZYPREXA) 5 MG disintegrating tablet Take 1 tablet (5 mg total) by mouth at bedtime. For mood control Patient not taking: Reported on 01/15/2017 12/02/16   Money, Gerlene Burdock, FNP  traZODone (DESYREL) 50 MG tablet Take 1 tablet (50 mg total) by mouth at bedtime as needed for sleep. Patient not taking: Reported on 01/15/2017 12/02/16   Money, Gerlene Burdock, FNP   Allergies  Allergen Reactions  . Naproxen Nausea And Vomiting  . Percocet [Oxycodone-Acetaminophen] Other (See Comments)    Acid reflux    FAMILY HISTORY:  family history includes Drug abuse in his father and mother; OCD in his mother; Schizophrenia in his father. SOCIAL HISTORY:  reports that he has been smoking Cigarettes.  He has a 5.00 pack-year smoking history. He has never used smokeless tobacco. He reports that he drinks about 0.5 oz of alcohol per week . He reports that he uses drugs, including Amphetamines, Benzodiazepines, Cocaine, Codeine, Fentanyl, Hashish, Heroin, Hydrocodone, Hydromorphone, LSD, Marijuana, MDMA (Ecstacy), Methamphetamines,  Morphine, Nitrous oxide, Opium, PCP, and Solvent inhalants, about 7 times per week.  REVIEW OF SYSTEMS:  Bolded items are pertinent positives Constitutional: Negative for fever, chills, weight loss, malaise/fatigue and diaphoresis.  HENT: Negative for hearing loss, ear pain, nosebleeds, congestion, sore throat, neck pain, tinnitus and ear discharge.   Eyes: Negative for blurred vision,  double vision, photophobia, pain, discharge and redness.  Respiratory: Negative for cough, hemoptysis, sputum production, shortness of breath, wheezing and stridor.   Cardiovascular: Negative for chest pain, palpitations, orthopnea, claudication, leg swelling and PND.  Gastrointestinal: Negative for heartburn, nausea, vomiting, abdominal pain, diarrhea, constipation, blood in stool and melena.  Genitourinary: Negative for dysuria, urgency, frequency, hematuria and flank pain.  Musculoskeletal: Negative for myalgias, back pain, joint pain and falls.  Skin: Negative for itching and rash.  Neurological: Negative for dizziness, tingling, tremors, sensory change, speech change, focal weakness, seizures, loss of consciousness, weakness and headaches.  Endo/Heme/Allergies: Negative for environmental allergies and polydipsia. Does not bruise/bleed easily. Psych: suicidal ideations, depressed mood  SUBJECTIVE:   VITAL SIGNS: Temp:  [97.5 F (36.4 C)] 97.5 F (36.4 C) (10/19 2039) Pulse Rate:  [73-92] 85 (10/20 0024) Resp:  [18] 18 (10/20 0024) BP: (80-93)/(38-47) 92/38 (10/20 0024) SpO2:  [91 %-100 %] 100 % (10/20 0024) Weight:  [60.3 kg (133 lb)] 60.3 kg (133 lb) (10/19 2039)  PHYSICAL EXAMINATION: General:  In no acute distress Neuro: EOM intact, no focal deficits HEENT: normocephalic atraumatic  Cardiovascular: S1 and S2 appreciated no rub no gallop Lungs: clear to auscultation bilaterally Abdomen:  Soft non tender + BS Musculoskeletal: no deformity no atrophy. + ROM Skin: intact no rashes   Recent Labs Lab 01/15/17 2059  NA 139  K 3.6  CL 105  CO2 25  BUN 24*  CREATININE 2.77*  GLUCOSE 105*    Recent Labs Lab 01/15/17 2059  HGB 14.3  HCT 41.7  WBC 8.1  PLT 282   No results found.   ASSESSMENT / PLAN: NEURO:   H/o Bipolar affective Depression S/p Suicide Attempt - 3rd attempt Pt expresses a clear plan and desire to commit suicide Poison Control was contacted  their recs are noted Pt on high dose insulin ggt -> positive inotrope in setting of CCA or BB toxicity Glucagon IV given as adjuvant- but induces nausea Prn zofran on board Placed on 1:1 watch Suicidal precautions Psych eval pending GCS 15 UDS positive for Amphetamines, Benzodiazepines, Opiates, and THC No seizure No focal deficits Not on sedation Continues on Neurochecks  H/o ETOH abuse Pt states that he binge drinks his last beer was 24 hrs ago CIWA protocol on board  H/o Benzodiazepines No flumanezil given- lowers seizure threshold Pt protecting his airway no compromise no resp depression at this time  CARDIAC: EKG showed sinus rhythm rate 92 QTc 438 Continues on Levophed MAP goal >60 Recommend ECHO given h/o IVDA   PULMONARY: no history of aspiration Maintaining his saturations Keep on [place] -> O2 sat >92% CXR pending Monitor for resp depression  ID: H/o IVDA In the past positive for cocaine but more recently uses Heroin Mother reports he is in a clean needle program Blood cx x 2 No new murmur on exam    Endocrine: No h/o of any issues Insulin drip Started on Insulin 1unit/kg/hr IV for overdose Continue on dextrose IV fluids BG checks Q 2   GI: Continue on Regular diet Aspiration  precautions  Glucagon may cause nausea  Prn zofran Some of the meds  ingested hepatotoxic Trend LFTs - currently not elevated Continue on IVF F/u coags   Heme: Coags sent hepatotoxic meds may result in coagulopathy No signs of active bleeding  If Hgb<7 transfuse PRBCs DVT PPx-> Heparin [place] and SCDs  RENAL H/o nephrolithiasis Acute Kidney Injury GFR 60->36 ACEi among those ingested Continue IVF  Trend Cr Insert foley and monitor UOP Lab Results  Component Value Date   CREATININE 2.77 (H) 01/15/2017   CREATININE 1.07 11/26/2016   CREATININE 1.05 04/11/2016  Hypokalemia- K+ repleted initially While on insulin ggt intracellular shift of potasium will continue Pt  received Calcium in ED continue to correct electrolyte imbalances      I, Dr Newell Coral have personally reviewed patient's available data, including medical history, events of note, physical examination and test results as part of my evaluation. I have discussed with NP Janyth Contes and other care providers such as pharmacist, RN and RRT. The patient is critically ill with multiple organ systems failure and requires high complexity decision making for assessment and support, frequent evaluation and titration of therapies, application of advanced monitoring technologies and extensive interpretation of multiple databases. Critical Care Time devoted to patient care services described in this note is 60 Minutes. This time reflects time of care of this signee Dr Newell Coral. This critical care time does not reflect procedure time, or teaching time or supervisory time of NP but could involve care discussion time    DISPOSITION: ICU ON 1:1 CC TIME: 60 MINS PROGNOSIS: GUARDED FAMILY: MOM - SPOKE TO HER ON PHONE [phone]   Signed Dr Newell Coral Pulmonary Critical Care Locums Pulmonary and Critical Care Medicine [facility]   01/16/2017, 12:54 AM

## 2017-01-16 NOTE — H&P (Signed)
.. ..  Name: Dustin Owens MRN: [id number] DOB: 02/22/1996    ADMISSION DATE:  01/15/2017 CONSULTATION DATE:  01/15/2017  REFERRING MD :  Cynda Acres ATTENDING CHIEF COMPLAINT: SUICIDE ATTEMPT- POLYPHARMACY  BRIEF PATIENT DESCRIPTION:  21 yr old PMHx of Anxiety, Depression, Headache, and 2 prior suicidal attempts presents from home after Mom found him at home took multiple pills has a h/o heroin use, meth and ETOH use. He expressed clearly that this was an attempt to end his life.   SIGNIFICANT EVENTS   STUDIES:  EKG  HISTORY OF PRESENT ILLNESS:  21 year old male with PMH of Anxiety, Depression, Headache , 2 prior suicidal attempts presents to ED on 10/19 after being found at home with SI by drug overdose. Patient reports that he took 80-90 (5mg ) amlodipine, unknown about of flexeril, 5-6 Soma, 5-6 Concerta about 6-10 hours ago. However for the last two days patient went on a meth and heroin binge. He also states that he drank 3-4 cases of beer within the last 3-4 days. Patient reports that he was tired of living with all the stress of life. He recently broke up with his high school girlfriend who he had a 5 yr relationship with. He has 2 young children.  His Mother expressed that the patient really tried to kill himself (he disconnected the battery in her car so she would not be able to drive him and had been planning this for some time now because she found empty pill bottles in a 1 gallon ziploc under his trunk) She states the following med bottles were found empty: Terbinafine metadate (gteneric Ritalin), lisinopril, amylodipine x 3, meloxicam 7.5mg  x 2, hydrocodone 2013, buspar, Metronidazole, and clindamyicin. Initial BP 80/43--> improved to 92 sys.  Placed on low dose levophed. PCCM asked to admit.   SUBJECTIVE:  N/V this AM, no other complaints  VITAL SIGNS: Temp:  [97.5 F (36.4 C)-98.5 F (36.9 C)] 98.5 F (36.9 C) (10/20 0753) Pulse Rate:  [73-110] 90 (10/20 0915) Resp:  [11-45]  17 (10/20 0915) BP: (78-139)/(26-71) 126/54 (10/20 0915) SpO2:  [91 %-100 %] 98 % (10/20 0915) Weight:  [60.3 kg (133 lb)-66.6 kg (146 lb 13.2 oz)] 66.6 kg (146 lb 13.2 oz) (10/20 0200)  PHYSICAL EXAMINATION: General:  Well appearing, NAD Neuro: Alert and interactive, moving all ext to command HEENT: [place]/AT, PERRL, EOM-I and MMM Cardiovascular: RRR, Nl S1/S2, -M/R/G. Lungs: CTA bilaterally Abdomen: Soft, NT, ND and +BS Musculoskeletal: -edema and -tenderness Skin: Intact  Recent Labs Lab 01/15/17 2059 01/16/17 0633  NA 139 137  K 3.6 3.8  CL 105 111  CO2 25 19*  BUN 24* 21*  CREATININE 2.77* 1.41*  GLUCOSE 105* 94    Recent Labs Lab 01/15/17 2059 01/16/17 0633  HGB 14.3 14.0  HCT 41.7 40.2  WBC 8.1 16.7*  PLT 282 293   Dg Chest Port 1 View  Result Date: 01/16/2017 CLINICAL DATA:  Overdose.  Smoker. EXAM: PORTABLE CHEST 1 VIEW COMPARISON:  None. FINDINGS: The heart size and mediastinal contours are within normal limits. Both lungs are clear. The visualized skeletal structures are unremarkable. IMPRESSION: No active disease. Electronically Signed   By: Burman Nieves M.D.   On: 01/16/2017 01:53   ASSESSMENT / PLAN: NEURO:   H/o Bipolar affective Depression S/p Suicide Attempt - 3rd attempt Pt expresses a clear plan and desire to commit suicide and shows absolutely no remorse for what he has done, clear danger to self. Poison Control was contacted  their recs are noted Will need to begin scaling back glucagon and insulin, stop glucagon now and insulin to be adjusted based on BS D/C glucagon due to N/V Add zofran for N/V Placed on 1:1 watch Suicidal precautions Psych consult placed in epic UDS positive for Amphetamines, Benzodiazepines, Opiates, and THC No sedation Watch for withdrawal  H/o ETOH abuse Pt states that he binge drinks his last beer was 24 hrs ago CIWA protocol on board Thiamine, folate and MVI  H/o Benzodiazepines No flumanezil given- lowers  seizure threshold Pt protecting his airway no compromise no resp depression at this time  CARDIAC: EKG showed sinus rhythm rate 92 QTc 438 Continues on Levophed MAP goal >60 Recommend ECHO given h/o IVDA but will wait until drugs are out of his system  PULMONARY: No intubation for now, mental status much improved Maintaining his saturations Keep on [place] -> O2 sat >92% CXR pending Monitor for resp depression  ID: H/o IVDA In the past positive for cocaine but more recently uses Heroin Mother reports he is in a clean needle program Blood cx x 2 No new murmur on exam   Endocrine: No h/o of any issues Insulin drip See above for insuline and glucagon use D/C dextrose in IVF BG checks Q 2  GI: Regular diet D/C Glucagon PRN zofran Some of the meds ingested hepatotoxic, check LFT in AM Trend LFTs - currently not elevated Continue on IVF F/u coags  Heme: Coags sent hepatotoxic meds may result in coagulopathy No signs of active bleeding  If Hgb<7 transfuse PRBCs DVT PPx-> Heparin [place] and SCDs  RENAL H/o nephrolithiasis Acute Kidney Injury GFR 60->36 ACEi among those ingested D/C IVF since eating and drinking Trend Cr Insert foley and monitor UOP Lab Results  Component Value Date   CREATININE 1.41 (H) 01/16/2017   CREATININE 2.77 (H) 01/15/2017   CREATININE 1.07 11/26/2016  Hypokalemia- K+ repleted initially While on insulin ggt intracellular shift of potasium will continue Pt received Calcium in ED Continue to correct electrolyte imbalances   The patient is critically ill with multiple organ systems failure and requires high complexity decision making for assessment and support, frequent evaluation and titration of therapies, application of advanced monitoring technologies and extensive interpretation of multiple databases.   Critical Care Time devoted to patient care services described in this note is  35  Minutes. This time reflects time of care of this signee Dr  Koren BoundWesam Yacoub. This critical care time does not reflect procedure time, or teaching time or supervisory time of PA/NP/Med student/Med Resident etc but could involve care discussion time.  Alyson ReedyWesam G. Yacoub, M.D. [facility]. Pager: [phone]. After hours pager: [phone].  01/16/2017, 9:31 AM

## 2017-01-17 ENCOUNTER — Inpatient Hospital Stay (HOSPITAL_COMMUNITY): Payer: Self-pay

## 2017-01-17 DIAGNOSIS — T1491XA Suicide attempt, initial encounter: Secondary | ICD-10-CM

## 2017-01-17 DIAGNOSIS — T50902A Poisoning by unspecified drugs, medicaments and biological substances, intentional self-harm, initial encounter: Secondary | ICD-10-CM

## 2017-01-17 LAB — BASIC METABOLIC PANEL
Anion gap: 7 (ref 5–15)
BUN: 11 mg/dL (ref 6–20)
CO2: 25 mmol/L (ref 22–32)
Calcium: 8.5 mg/dL — ABNORMAL LOW (ref 8.9–10.3)
Chloride: 104 mmol/L (ref 101–111)
Creatinine, Ser: 0.8 mg/dL (ref 0.61–1.24)
GFR calc Af Amer: >60 mL/min (ref >60)
GLUCOSE: 112 mg/dL — AB (ref 65–99)
POTASSIUM: 4.4 mmol/L (ref 3.5–5.1)
SODIUM: 136 mmol/L (ref 135–145)

## 2017-01-17 LAB — MAGNESIUM: MAGNESIUM: 1.4 mg/dL — AB (ref 1.7–2.4)

## 2017-01-17 LAB — ECHOCARDIOGRAM COMPLETE
HEIGHTINCHES: 72 in
Weight: 2264.57 oz

## 2017-01-17 LAB — CBC
HCT: 42 % (ref 39.0–52.0)
Hemoglobin: 14.3 g/dL (ref 13.0–17.0)
MCH: 29.1 pg (ref 26.0–34.0)
MCHC: 34 g/dL (ref 30.0–36.0)
MCV: 85.4 fL (ref 78.0–100.0)
PLATELETS: 327 10*3/uL (ref 150–400)
RBC: 4.92 MIL/uL (ref 4.22–5.81)
RDW: 13.3 % (ref 11.5–15.5)
WBC: 8 10*3/uL (ref 4.0–10.5)

## 2017-01-17 LAB — PHOSPHORUS: Phosphorus: 4.1 mg/dL (ref 2.5–4.6)

## 2017-01-17 MED ORDER — TRAZODONE HCL 50 MG PO TABS
50.0000 mg | ORAL_TABLET | Freq: Once | ORAL | Status: AC
  Administered 2017-01-17: 50 mg via ORAL
  Filled 2017-01-17: qty 1

## 2017-01-17 NOTE — Progress Notes (Signed)
eLink Physician-Brief Progress Note Patient Name: Dustin Owens DOB: [birth year] MRN: [id number]   Date of Service  01/17/2017  HPI/Events of Note  Anxiety  eICU Interventions  Order home trazodone - one dose for tonight     Intervention Category Minor Interventions: Agitation / anxiety - evaluation and management  Kaveh Kissinger 01/17/2017, 10:19 PM

## 2017-01-17 NOTE — Progress Notes (Signed)
.. ..  Name: Dustin Owens MRN: [id number] DOB: 25-Jan-1996    ADMISSION DATE:  01/15/2017 CONSULTATION DATE:  01/15/2017  REFERRING MD :  Cynda Acres ATTENDING CHIEF COMPLAINT: SUICIDE ATTEMPT- POLYPHARMACY  BRIEF PATIENT DESCRIPTION:  21 yr old PMHx of Anxiety, Depression, Headache, and 2 prior suicidal attempts presents from home after Mom found him at home took multiple pills has a h/o heroin use, meth, THC and ETOH use. He expressed clearly that this was an attempt to end his life.   SIGNIFICANT EVENTS   STUDIES:  EKG  HISTORY OF PRESENT ILLNESS:  21 year old male with PMH of Anxiety, Depression, Headache , 2 prior suicidal attempts presents to ED on 10/19 after being found at home with SI by drug overdose. Patient reports that he took 80-90 (5mg ) amlodipine, unknown about of flexeril, 5-6 Soma, 5-6 Concerta about 6-10 hours ago. However for the last two days patient went on a meth and heroin binge. He also states that he drank 3-4 cases of beer within the last 3-4 days. Patient reports that he was tired of living with all the stress of life. He recently broke up with his high school girlfriend who he had a 5 yr relationship with. He has 2 young children.  His Mother expressed that the patient really tried to kill himself (he disconnected the battery in her car so she would not be able to drive him and had been planning this for some time now because she found empty pill bottles in a 1 gallon ziploc under his trunk) She states the following med bottles were found empty: Terbinafine metadate (gteneric Ritalin), lisinopril, amylodipine x 3, meloxicam 7.5mg  x 2, hydrocodone 2013, buspar, Metronidazole, and clindamyicin. Initial BP 80/43--> improved to 92 sys.  Placed on low dose levophed. PCCM asked to admit.   SUBJECTIVE:  N/V this AM, no other complaints  VITAL SIGNS: Temp:  [98.1 F (36.7 C)-99 F (37.2 C)] 98.5 F (36.9 C) (10/21 0355) Pulse Rate:  [66-103] 74 (10/21 0500) Resp:   [11-31] 15 (10/21 0800) BP: (89-127)/(32-90) 100/45 (10/21 0800) SpO2:  [92 %-100 %] 98 % (10/21 0500) Weight:  [64.2 kg (141 lb 8.6 oz)] 64.2 kg (141 lb 8.6 oz) (10/21 0200)  PHYSICAL EXAMINATION: General:  Well appearing, NAD Neuro: Alert and interactive, moving all ext to command HEENT: [place]/AT, PERRL, EOM-I and MMM Cardiovascular: RRR, Nl S1/S2, -M/R/G. Lungs: CTA bilaterally Abdomen: Soft, NT, ND and +BS Musculoskeletal: -edema and -tenderness Skin: Intact  Recent Labs Lab 01/15/17 2059 01/16/17 0633 01/17/17 0401  NA 139 137 136  K 3.6 3.8 4.4  CL 105 111 104  CO2 25 19* 25  BUN 24* 21* 11  CREATININE 2.77* 1.41* 0.80  GLUCOSE 105* 94 112*    Recent Labs Lab 01/15/17 2059 01/16/17 0633 01/17/17 0401  HGB 14.3 14.0 14.3  HCT 41.7 40.2 42.0  WBC 8.1 16.7* 8.0  PLT 282 293 327   Dg Chest Port 1 View  Result Date: 01/16/2017 CLINICAL DATA:  Overdose.  Smoker. EXAM: PORTABLE CHEST 1 VIEW COMPARISON:  None. FINDINGS: The heart size and mediastinal contours are within normal limits. Both lungs are clear. The visualized skeletal structures are unremarkable. IMPRESSION: No active disease. Electronically Signed   By: Burman Nieves M.D.   On: 01/16/2017 01:53   ASSESSMENT / PLAN:  H/o Bipolar affective Depression S/p Suicide Attempt - 3rd attempt Pt expresses a clear plan and desire to commit suicide and shows absolutely no remorse for what  he has done, clear danger to self.  Psychiatry recommends inpatient admission when medically stable and off pressors. Poison Control was contacted their recs are noted Placed on 1:1 watch Suicidal precautions Psych consult appreciated, needs inpatient psych when medically stable UDS positive for Amphetamines, Benzodiazepines, Opiates, and THC No sedation Watch for withdrawal Ambulate  H/o ETOH abuse Pt states that he binge drinks his last beer was 24 hrs ago CIWA protocol on board Thiamine, folate and MVI  H/o  Benzodiazepines No flumanezil given- lowers seizure threshold Pt protecting his airway no compromise no resp depression at this time  CARDIAC: EKG showed sinus rhythm rate 92 QTc 438 Attempt to get off levophed today Recommend ECHO given h/o IVDA but will wait until drugs are out of his system  PULMONARY: No intubation for now, mental status much improved Maintaining his saturations Keep on [place] -> O2 sat >92% CXR pending Monitor for resp depression  ID: H/o IVDA In the past positive for cocaine but more recently uses Heroin Mother reports he is in a clean needle program Blood cx x 2 NTD No new murmur on exam   Endocrine: No h/o of any issues Insulin drip See above for insuline and glucagon use D/C dextrose in IVF BG checks Q 2  GI: Regular diet D/C Glucagon PRN zofran Some of the meds ingested hepatotoxic, check LFT in AM Trend LFTs - currently not elevated Continue on IVF F/u coags  Heme: Coags sent hepatotoxic meds may result in coagulopathy No signs of active bleeding  If Hgb<7 transfuse PRBCs DVT PPx-> Heparin W[place] and SCDs  RENAL H/o nephrolithiasis Acute Kidney Injury GFR 60->36 ACEi among those ingested D/C IVF since eating and drinking Trend Cr Insert foley and monitor UOP Lab Results  Component Value Date   CREATININE 0.80 01/17/2017   CREATININE 1.41 (H) 01/16/2017   CREATININE 2.77 (H) 01/15/2017  Hypokalemia- K+ repleted initially While on insulin ggt intracellular shift of potasium will continue Pt received Calcium in ED Continue to correct electrolyte imbalances   If able to get off pressors today then will consider transfer to behavioral health in AM  The patient is critically ill with multiple organ systems failure and requires high complexity decision making for assessment and support, frequent evaluation and titration of therapies, application of advanced monitoring technologies and extensive interpretation of multiple databases.    Critical Care Time devoted to patient care services described in this note is  35  Minutes. This time reflects time of care of this signee Dr Koren BoundWesam Yacoub. This critical care time does not reflect procedure time, or teaching time or supervisory time of PA/NP/Med student/Med Resident etc but could involve care discussion time.  Alyson ReedyWesam G. Yacoub, M.D. [facility]/Critical Care Medicine. Pager: [phone]. After hours pager: [phone].  01/17/2017, 8:38 AM

## 2017-01-17 NOTE — Progress Notes (Signed)
  Echocardiogram 2D Echocardiogram has been performed.  Dustin Owens 01/17/2017, 9:49 AM

## 2017-01-18 ENCOUNTER — Encounter (HOSPITAL_COMMUNITY): Payer: Self-pay

## 2017-01-18 ENCOUNTER — Inpatient Hospital Stay (HOSPITAL_COMMUNITY)
Admission: AD | Admit: 2017-01-18 | Discharge: 2017-01-26 | DRG: 885 | Disposition: A | Discharge status: Home / Self Care | Payer: Federal, State, Local not specified - Other | Source: Intra-hospital | Attending: Psychiatry | Admitting: Psychiatry

## 2017-01-18 DIAGNOSIS — F314 Bipolar disorder, current episode depressed, severe, without psychotic features: Principal | ICD-10-CM | POA: Diagnosis present

## 2017-01-18 DIAGNOSIS — F1994 Other psychoactive substance use, unspecified with psychoactive substance-induced mood disorder: Secondary | ICD-10-CM | POA: Diagnosis not present

## 2017-01-18 DIAGNOSIS — F1920 Other psychoactive substance dependence, uncomplicated: Secondary | ICD-10-CM | POA: Diagnosis not present

## 2017-01-18 DIAGNOSIS — Z915 Personal history of self-harm: Secondary | ICD-10-CM

## 2017-01-18 DIAGNOSIS — Z590 Homelessness: Secondary | ICD-10-CM

## 2017-01-18 DIAGNOSIS — F17210 Nicotine dependence, cigarettes, uncomplicated: Secondary | ICD-10-CM | POA: Diagnosis present

## 2017-01-18 DIAGNOSIS — Z818 Family history of other mental and behavioral disorders: Secondary | ICD-10-CM

## 2017-01-18 DIAGNOSIS — T481X2A Poisoning by skeletal muscle relaxants [neuromuscular blocking agents], intentional self-harm, initial encounter: Secondary | ICD-10-CM | POA: Diagnosis not present

## 2017-01-18 DIAGNOSIS — F1910 Other psychoactive substance abuse, uncomplicated: Secondary | ICD-10-CM | POA: Diagnosis present

## 2017-01-18 DIAGNOSIS — F1220 Cannabis dependence, uncomplicated: Secondary | ICD-10-CM | POA: Diagnosis present

## 2017-01-18 DIAGNOSIS — F1120 Opioid dependence, uncomplicated: Secondary | ICD-10-CM | POA: Diagnosis present

## 2017-01-18 DIAGNOSIS — F1620 Hallucinogen dependence, uncomplicated: Secondary | ICD-10-CM | POA: Diagnosis present

## 2017-01-18 DIAGNOSIS — Z560 Unemployment, unspecified: Secondary | ICD-10-CM | POA: Diagnosis not present

## 2017-01-18 DIAGNOSIS — F1420 Cocaine dependence, uncomplicated: Secondary | ICD-10-CM | POA: Diagnosis present

## 2017-01-18 DIAGNOSIS — F1320 Sedative, hypnotic or anxiolytic dependence, uncomplicated: Secondary | ICD-10-CM | POA: Diagnosis present

## 2017-01-18 DIAGNOSIS — F1520 Other stimulant dependence, uncomplicated: Secondary | ICD-10-CM | POA: Diagnosis present

## 2017-01-18 DIAGNOSIS — T461X2A Poisoning by calcium-channel blockers, intentional self-harm, initial encounter: Secondary | ICD-10-CM | POA: Diagnosis not present

## 2017-01-18 LAB — CBC
HEMATOCRIT: 40.6 % (ref 39.0–52.0)
HEMOGLOBIN: 13.6 g/dL (ref 13.0–17.0)
MCH: 29.3 pg (ref 26.0–34.0)
MCHC: 33.5 g/dL (ref 30.0–36.0)
MCV: 87.5 fL (ref 78.0–100.0)
Platelets: 252 10*3/uL (ref 150–400)
RBC: 4.64 MIL/uL (ref 4.22–5.81)
RDW: 13.6 % (ref 11.5–15.5)
WBC: 5.6 10*3/uL (ref 4.0–10.5)

## 2017-01-18 LAB — HEPATIC FUNCTION PANEL
ALK PHOS: 87 U/L (ref 38–126)
ALT: 14 U/L — AB (ref 17–63)
AST: 17 U/L (ref 15–41)
Albumin: 3.8 g/dL (ref 3.5–5.0)
BILIRUBIN TOTAL: 0.5 mg/dL (ref 0.3–1.2)
Total Protein: 7.4 g/dL (ref 6.5–8.1)

## 2017-01-18 LAB — PROTIME-INR
INR: 0.89
Prothrombin Time: 12 seconds (ref 11.4–15.2)

## 2017-01-18 LAB — BASIC METABOLIC PANEL
Anion gap: 9 (ref 5–15)
BUN: 14 mg/dL (ref 6–20)
CHLORIDE: 100 mmol/L — AB (ref 101–111)
CO2: 28 mmol/L (ref 22–32)
CREATININE: 0.92 mg/dL (ref 0.61–1.24)
Calcium: 8.8 mg/dL — ABNORMAL LOW (ref 8.9–10.3)
GFR calc Af Amer: >60 mL/min (ref >60)
GFR calc non Af Amer: >60 mL/min (ref >60)
GLUCOSE: 97 mg/dL (ref 65–99)
Potassium: 4.7 mmol/L (ref 3.5–5.1)
Sodium: 137 mmol/L (ref 135–145)

## 2017-01-18 LAB — MAGNESIUM: Magnesium: 1.8 mg/dL (ref 1.7–2.4)

## 2017-01-18 LAB — APTT: APTT: 33 s (ref 24–36)

## 2017-01-18 LAB — PHOSPHORUS: Phosphorus: 5 mg/dL — ABNORMAL HIGH (ref 2.5–4.6)

## 2017-01-18 MED ORDER — LORAZEPAM 1 MG PO TABS: 1.0000 mg | ORAL_TABLET | Freq: Four times a day (QID) | ORAL | Status: DC | PRN

## 2017-01-18 MED ORDER — LORAZEPAM 2 MG/ML IJ SOLN: 1.0000 mg | Freq: Four times a day (QID) | INTRAMUSCULAR | Status: DC | PRN

## 2017-01-18 MED ORDER — ADULT MULTIVITAMIN W/MINERALS CH: 1.0000 | ORAL_TABLET | Freq: Every day | ORAL | 0 refills | Status: DC

## 2017-01-18 MED ORDER — MAGNESIUM HYDROXIDE 400 MG/5ML PO SUSP: 30.0000 mL | Freq: Every day | ORAL | Status: DC | PRN

## 2017-01-18 MED ORDER — NICOTINE 21 MG/24HR TD PT24
21.0000 mg | MEDICATED_PATCH | Freq: Every day | TRANSDERMAL | Status: DC
  Administered 2017-01-19: 21 mg via TRANSDERMAL
  Filled 2017-01-18 (×3): qty 1

## 2017-01-18 MED ORDER — ALUM & MAG HYDROXIDE-SIMETH 200-200-20 MG/5ML PO SUSP: 30.0000 mL | ORAL | Status: DC | PRN

## 2017-01-18 MED ORDER — NICOTINE 14 MG/24HR TD PT24
14.0000 mg | MEDICATED_PATCH | Freq: Every day | TRANSDERMAL | Status: DC
  Administered 2017-01-18: 14 mg via TRANSDERMAL
  Filled 2017-01-18: qty 1

## 2017-01-18 MED ORDER — NICOTINE 14 MG/24HR TD PT24: 14.0000 mg | MEDICATED_PATCH | Freq: Every day | TRANSDERMAL | 0 refills | Status: DC

## 2017-01-18 MED ORDER — TRAZODONE HCL 100 MG PO TABS
100.0000 mg | ORAL_TABLET | Freq: Every evening | ORAL | Status: DC | PRN
  Administered 2017-01-18 – 2017-01-25 (×10): 100 mg via ORAL
  Filled 2017-01-18 (×3): qty 1
  Filled 2017-01-18: qty 14
  Filled 2017-01-18 (×4): qty 1
  Filled 2017-01-18: qty 14
  Filled 2017-01-18 (×11): qty 1

## 2017-01-18 MED ORDER — TRAZODONE HCL 50 MG PO TABS
50.0000 mg | ORAL_TABLET | Freq: Every evening | ORAL | Status: DC | PRN
Start: 2017-01-18 — End: 2017-01-18
  Administered 2017-01-18: 50 mg via ORAL
  Filled 2017-01-18: qty 1

## 2017-01-18 MED ORDER — THIAMINE HCL 100 MG PO TABS: 100.0000 mg | ORAL_TABLET | Freq: Every day | ORAL | 0 refills | Status: DC

## 2017-01-18 MED ORDER — FOLIC ACID 1 MG PO TABS: 1.0000 mg | ORAL_TABLET | Freq: Every day | ORAL | 0 refills | Status: DC

## 2017-01-18 MED ORDER — ENSURE ENLIVE PO LIQD
237.0000 mL | Freq: Two times a day (BID) | ORAL | Status: DC
  Administered 2017-01-19 – 2017-01-25 (×13): 237 mL via ORAL

## 2017-01-18 NOTE — Tx Team (Signed)
Initial Treatment Plan 01/18/2017 9:42 PM Dustin Owens ION:[id number]    PATIENT STRESSORS: Loss of relationship with ex wife Marital or family conflict   PATIENT STRENGTHS: Ability for insight Active sense of humor Physical Health   PATIENT IDENTIFIED PROBLEMS: "Getting out of here"  "I want to quit coming here"                   DISCHARGE CRITERIA:  Ability to meet basic life and health needs Adequate post-discharge living arrangements Improved stabilization in mood, thinking, and/or behavior  PRELIMINARY DISCHARGE PLAN: Attend 12-step recovery group Outpatient therapy  PATIENT/FAMILY INVOLVEMENT: This treatment plan has been presented to and reviewed with the patient, [name].  The patient and family have been given the opportunity to ask questions and make suggestions.  Lindajo Royalaniel P Vana Arif, RN 01/18/2017, 9:42 PM

## 2017-01-18 NOTE — Discharge Summary (Signed)
Physician Discharge Summary  Patient ID: LOGIN MUCKLEROY MRN: [id number] DOB/AGE: [birth year] 21 y.o.  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  Participates in clean needle program.  Tobacco Abuse Acute Kidney Injury  Hx Nephronophthisis Hypokalemia                                                                         DISCHARGE PLAN BY DIAGNOSIS     Suicide Attempt  Bipolar Affective Disorder ETOH Abuse  Anxiety with Benzodiazepine Abuse IVDA - cocaine, heroin.  Participates in clean needle program.   Discharge Plan:  Discharge to inpatient PSY  Further psychiatry medications per Behavioral Health  Continue thiamine, folate, MVI    Tobacco Abuse   Discharge Plan:  Nicotine patch    Mild Mitral / Tricuspid Regurgitation Small Pericardial Effusion   Discharge Plan:  Will need outpatient follow up ECHO in 4-6 weeks per PCP   Acute Kidney Injury - resolved Hx Nephronophthisis Hypokalemia - resolved  Discharge Plan:  No acute follow up at discharge                     DISCHARGE SUMMARY    21 y/o Male with PMH of tobacco abuse, anxiety, bipolar depression and two prior suicide attempts by overdose who was admitted on 10/19 after reportedly ingesting 80-90 amlodipine, unknown amount of flexeril, 5-6 Soma, 5-6 Concerta.  He also was reportedly on a meth and heroin binge the few days prior to admit and drank 3-4 cases of beer.  The patient reported on admit he was tired of living with the stress of life. He has two small children and recently broke up with his high school girlfriend of 5 years.  Mother reported he disconnected the battery in her car so she could not drive him to the hospital.  She found empty pill bottles in a ziploc bag under his bed (terbinafine metadate,  lisinopril, amlodipine, meloxicam, hydrocodone, buspar, metronidazole & clindamycin).  He was initially noted to be hypotensive and placed on levophed.  The patient was empirically treated with thiamine, folate & MVI.  QTc was 438 on EKG.  UDS was positive for amphetamines, benzodiazepines, opiates and marijuana.  Alcohol <10.  He was treated with insulin and glucagon in the setting of polysubstance overdose. Initial labs showed acute kidney injury.  He was admitted for further care.  ECHO assessed and demonstrated an LVEF of 55-60%, normal wall motion, trace MR/TR and trivial pericardial effusion.  The patient was weaned off vasopressors.  Sr Cr returned to normal. He was evaluated by Psychiatry and felt to be a candidate for inpatient admission to [facility]. The patient was medically cleared for discharge 10/22 to [facility] with plans as above.                 SIGNIFICANT DIAGNOSTIC STUDIES ECHO 10/21 >> LVEF of 55-60%, normal wall motion, trace MR/TR and trivial pericardial effusion  MICRO DATA  BCx2 10/20 >>   CONSULTS Psychiatry - Dr. Louretta Shorten   Discharge Exam: General: thin young adult male in  NAD, lying in bed.  Sitter at bedside.   HEENT: MM pink/moist, no jvd PSY: calm/appropriate Neuro: AAOx4, speech clear CV: s1s2 rrr, no m/r/g  PULM: even/non-labored, lungs bilaterally clear  KF:MMCR, non-tender, bsx4 active  Extremities: warm/dry, no edema  Skin: no rashes or lesions  Vitals:   01/18/17 0800 01/18/17 1130 01/18/17 1200 01/18/17 1535  BP: 121/61  (!) 124/55   Pulse: 77  99   Resp: 15     Temp:  97.7 F (36.5 C)  97.7 F (36.5 C)  TempSrc:  Oral  Oral  SpO2: 98%     Weight:      Height:         Discharge Labs  BMET  Recent Labs Lab 01/15/17 2059 01/16/17 0633 01/17/17 0401 01/18/17 0404  NA 139 137 136 137  K 3.6 3.8 4.4 4.7  CL 105 111 104 100*  CO2 25 19* 25 28  GLUCOSE 105* 94 112* 97  BUN 24* 21* 11 14  CREATININE 2.77* 1.41* 0.80 0.92   CALCIUM 8.9 8.5* 8.5* 8.8*  MG  --  1.6* 1.4* 1.8  PHOS  --  2.3* 4.1 5.0*    CBC  Recent Labs Lab 01/16/17 0633 01/17/17 0401 01/18/17 0404  HGB 14.0 14.3 13.6  HCT 40.2 42.0 40.6  WBC 16.7* 8.0 5.6  PLT 293 327 252    Anti-Coagulation  Recent Labs Lab 01/16/17 0633 01/18/17 1100  INR 1.23 0.89    Allergies as of 01/18/2017      Reactions   Naproxen Nausea And Vomiting   Percocet [oxycodone-acetaminophen] Other (See Comments)   Acid reflux      Medication List    STOP taking these medications   carbamazepine 200 MG tablet Commonly known as:  TEGRETOL   hydrOXYzine 25 MG tablet Commonly known as:  ATARAX/VISTARIL   OLANZapine zydis 5 MG disintegrating tablet Commonly known as:  ZYPREXA   traZODone 50 MG tablet Commonly known as:  DESYREL     TAKE these medications   folic acid 1 MG tablet Commonly known as:  FOLVITE Take 1 tablet (1 mg total) by mouth daily.   multivitamin with minerals Tabs tablet Take 1 tablet by mouth daily.   nicotine 14 mg/24hr patch Commonly known as:  NICODERM CQ - dosed in mg/24 hours Place 1 patch (14 mg total) onto the skin daily.   thiamine 100 MG tablet Take 1 tablet (100 mg total) by mouth daily.         Disposition:  Behavioral Health Inpatient  Discharged Condition: [place] has met maximum benefit of inpatient care and is medically stable and cleared for discharge.  Patient is pending follow up as above.      Time spent on disposition:  35 Minutes.   Signed: Noe Gens, NP-C [place] [facility] Pgr: [phone] Office: [phone]

## 2017-01-18 NOTE — Plan of Care (Signed)
Problem: Coping: Goal: Ability to cope will improve Outcome: Not Progressing Pt states "I can't cope. I will just suffer."

## 2017-01-18 NOTE — Progress Notes (Signed)
[facility] Attending Note  ADMISSION DATE:  01/15/2017  CONSULTATION DATE:  01/15/2017  REFERRING MD:  Peggyann Juba, M.D. / EDP  CHIEF COMPLAINT: SUICIDE ATTEMPT- POLYPHARMACY  Presenting HPI:  21 y.o. male with known past medical history of depression, headache, anxiety, and 2 prior suicide attempts. Patient has a known history of polysubstance abuse including heroin, methamphetamine, marijuana, and alcohol. Found at home by mother after he overdosed on multiple medical medications. He expressed clearly that this was an attempt to end his life.  Subjective:  Weaned off sedation about 24 hours ago. Patient denies any chest pain or pressure. He denies any dyspnea or cough. Does report some anxiety with nicotine withdrawal.  Review of Systems:   No abdominal pain or nausea. No subjective fever or chills. Tolerating diet.  Temp:  [97.8 F (36.6 C)-98.7 F (37.1 C)] 97.8 F (36.6 C) (10/22 0730) Pulse Rate:  [65-104] 77 (10/22 0800) Resp:  [14-29] 15 (10/22 0800) BP: (90-121)/(34-77) 121/61 (10/22 0800) SpO2:  [98 %-100 %] 98 % (10/22 0800) Weight:  [137 lb 5.6 oz (62.3 kg)] 137 lb 5.6 oz (62.3 kg) (10/22 0500)  General:  No family at bedside. Awake. No distress. Integument:  Warm & dry. No rash on exposed skin. Tattoos noted. HEENT:  Moist mucus memebranes. No scleral icterus.  Neurological:  Cranial nerves grossly intact. No meningismus. Moving all 4 extremities equally. Musculoskeletal:  No joint effusion or erythema appreciated. Symmetric muscle bulk. Pulmonary:  Normal work of breathing on room air. Clear bilaterally with auscultation. Cardiovascular:  Regular rate & rhythm. No appreciable JVD. Normal S1 & S2. Abdomen:  Soft. Nondistended. Normoactive bowel sounds.  LINES/TUBES: PIV  CBC Latest Ref Rng & Units 01/18/2017 01/17/2017 01/16/2017  WBC 4.0 - 10.5 K/uL 5.6 8.0 16.7(H)  Hemoglobin 13.0 - 17.0 g/dL 13.6 14.3 14.0  Hematocrit 39.0 - 52.0 %  40.6 42.0 40.2  Platelets 150 - 400 K/uL 252 327 293   BMP Latest Ref Rng & Units 01/18/2017 01/17/2017 01/16/2017  Glucose 65 - 99 mg/dL 97 112(H) 94  BUN 6 - 20 mg/dL 14 11 21(H)  Creatinine 0.61 - 1.24 mg/dL 0.92 0.80 1.41(H)  Sodium 135 - 145 mmol/L 137 136 137  Potassium 3.5 - 5.1 mmol/L 4.7 4.4 3.8  Chloride 101 - 111 mmol/L 100(L) 104 111  CO2 22 - 32 mmol/L 28 25 19(L)  Calcium 8.9 - 10.3 mg/dL 8.8(L) 8.5(L) 8.5(L)   Hepatic Function Latest Ref Rng & Units 01/15/2017 11/26/2016 04/11/2016  Total Protein 6.5 - 8.1 g/dL 6.8 7.5 6.8  Albumin 3.5 - 5.0 g/dL 3.9 4.5 4.5  AST 15 - 41 U/L 20 26 25   ALT 17 - 63 U/L 11(L) 26 20  Alk Phosphatase 38 - 126 U/L 86 98 72  Total Bilirubin 0.3 - 1.2 mg/dL 0.5 0.8 0.8  Bilirubin, Direct 0.0 - 0.3 mg/dL - - -    IMAGING/STUDIES: UDS 10/19:  Positive for Amphetamines, Benzodiazepines, Opiates, and THC. EKG 10/19:  Personally reviewed by me.QTc 4:30 milliseconds. Sinus rhythm. PORT CXR 10/20:  Personally reviewed by me. Hyperinflation suggestive by flattening of the diaphragms. No pleural effusion. No parenchymal mass or opacity appreciated. Heart and mediastinal contours normal. TTE 10/21:  LV normal in size with EF 55-60%. No regional wall motion abnormalities. LA & RA normal in size. RV normal in size and function. Aortic root normal in size. Poorly visualized aortic valve with no obvious regurgitation or stenosis. Trivial mitral regurgitation without stenosis. No  pulmonic stenosis. Trivial tricuspid regurgitation. Trivial pericardial effusion.  MICROBIOLOGY: MRSA PCR 10/20:  Negative  Blood Cultures x2 10/20 >>>  ANTIBIOTICS: None.  SIGNIFICANT EVENTS: 10/19 - Admit 10/21 - Levophed stopped around 9am.  ASSESSMENT/PLAN:  21 y.o. male with history of bipolar affective depression now having attempted suicide for the third time. Patient also with known polysubstance abuse as well as urine drug screen confirming this on admission. Evaluated  by psychiatry and recommended for inpatient admission to psychiatric hospital once medically stable.  1. Suicide attempt/overdose: Severe at bedside. Continuing suicide precautions. Plan for inpatient psychiatric admission. Further management per psychiatry. Checking hepatic function panel today. Checking EKG today.  2. History of alcohol abuse: Last drink 24 hours prior to admission. Starting CIWA protocol. Continuing oral thiamine, folic acid, and multivitamin. 3. Polysubstance abuse: This includes benzodiazepines. Monitoring for further signs of withdrawal. 4. Shock: Resolved. Likely secondary to overdose. Monitoring vitals per protocol.History of IV drug use: Patient has used cocaine as well as heroin. Blood cultures remain negative. Echocardiogram without obvious vegetation. 5. Acute renal failure: Resolving. Avoiding nephrotoxic agents. 6. Coagulopathy: Mild. Repeating coags stat. 7. Hypophosphatemia: Mild. Likely due to recovery. 8. Pericardial effusion:  Patient will ultimately need a follow-up echocardiogram in 4-6 weeks.  9. Hypomagnesemia: Resolved. 10. Hypokalemia: Resolved.  Prophylaxis:  Heparin [place] q8hr. Diet:  Regular Diet.  Code Status:  Full Code. Disposition:  Plan for discharge to [facility] today.  Family Update:  Patient udpated at the time of my exam.   I have spent a total of 36 minutes of time today caring for the patient, reviewing the patient's electronic medical record, and with more than 50% of that time spent coordinating transfer of care with the patient as well as reviewing the continuing plan of care with the patient and nursing staff at bedside.  Sonia Baller Ashok Cordia, M.D. [facility] Pager:  [phone] After 7pm or if no response, call [phone] 10:37 AM 01/18/17

## 2017-01-18 NOTE — Progress Notes (Addendum)
Dustin Owens is a 21 y.o. male involuntarily admitted for overdosed on prescriptions of (per H&P) "80-90 (5mg ) amlodipine, unknown about of flexeril, 5-6 Soma, 5-6 Concerta" was in intensive care x3 days.   Denies suicidality now.  Using amphetimines mainly, but also opiates including heroin.  Main issue is "panic" per patient.  Medical and surgical history reviewed and noted below.  Basic search of patient completed with skin check completed and normal.  Belongings reviewed and noted on belongings record.  Oriented to unit and rules, consents and treatment agreement reviewed with patient and signed.  Allergies  Allergen Reactions  . Naproxen Nausea And Vomiting  . Percocet [Oxycodone-Acetaminophen] Other (See Comments)    Acid reflux   Past Medical History:  Diagnosis Date  . Allergy   . Anxiety   . Depression   . Headache(784.0)   . Vision abnormalities    Past Surgical History:  Procedure Laterality Date  . NO PAST SURGERIES

## 2017-01-18 NOTE — Clinical Social Work Note (Addendum)
Clinical Social Work Assessment  Patient Details  Name: Dustin Owens MRN: [id number] Date of Birth: [birth year]  Date of referral:  01/18/17               Reason for consult:  Facility Placement                Permission sought to share information with:  Psychiatrist Permission granted to share information::     Name::        Agency::     Relationship::     Contact Information:     Housing/Transportation Living arrangements for the past 2 months:  No permanent address Source of Information:  Patient Patient Interpreter Needed:  None Criminal Activity/Legal Involvement Pertinent to Current Situation/Hospitalization:  No - Comment as needed Significant Relationships:  Parents, Other Family Members Lives with:  Other (Comment) ("Whoever has a couch available.") Do you feel safe going back to the place where you live?  Yes Need for family participation in patient care:  Yes (Comment)  Care giving concerns:  Admitted for intentional Overdose.Patient reports he overdosed by intentionally taking several medications  to end his life to stop the stressors.  Patient has been non compliant with his medication management for depression, Anxiety and ADHD.    Patient reports this is his 7th admission to inpatient psychiatric hospital and third time at behavioral health this year.  The patient reports he is often triggered by his social situation with the mother of his children, "she is keeping my children from me." Patient also reports having severe anxiety attacks and depression secondary to his drug use and his current social issues.   Patient states, "I use everything." He says at times he will go on a drug binge and then sleep for days. Patient reports he is not currently in any treatment program.  [facility] and is hopeful this time will be as well.    Social Worker assessment / plan:  Patient seen by psychiatrist and recommends inpatient  hospitalization.  [facility]. Patient clinical information being reviewed by [facility].   Plan: Discharge to Inpatient  psychiatric hospital.   Employment status:  Unemployed Insurance information:  Self Pay (Medicaid Pending) PT Recommendations:  Not assessed at this time Information / Referral to community resources:  Inpatient Psychiatric Care (Comment Required)  Patient/Family's Response to care:  No family at bedside,Patient under IVC.Patient appreciative of CSW services.   Patient/Family's Understanding of and Emotional Response to Diagnosis, Current Treatment, and Prognosis: Patient reports, "I just want help, to learn how to deal with all of this."   Emotional Assessment Appearance:  Appears older than stated age Attitude/Demeanor/Rapport:    Affect (typically observed):  Calm, Pleasant Orientation:  Oriented to Self, Oriented to Place, Oriented to  Time, Oriented to Situation Alcohol / Substance use:  Illicit Drugs, Alcohol Use Psych involvement (Current and /or in the community):  Yes (Comment)  Discharge Needs  Concerns to be addressed:  Mental Health Concerns, Substance Abuse Concerns Readmission within the last 30 days:  Yes Current discharge risk:  Substance Abuse Barriers to Discharge:  No Barriers Identified   Clearance CootsNicole A Tremell Reimers, LCSW 01/18/2017, 4:44 PM

## 2017-01-18 NOTE — Progress Notes (Addendum)
CSW received a call from [facility] Number for report is: [phone] (Report must be called before pt is transported) Pt's unit/room/bed number will be: 304 Bed 2 Accepting physician: Dr. Elsie SaasJonnalagadda   Pt can arrive on 01/18/17 after 7:30pm  CSW will update RN.  Dorothe PeaJonathan F. Bethannie Iglehart, LCSW, LCAS, CSI Clinical Social Worker Ph: [phone]

## 2017-01-18 NOTE — Progress Notes (Signed)
Patient ID: Dustin Owens, male   DOB: 04/19/1995, 21 y.o.   MRN: [id number]   Report accepted from admitting nurse, Jesusita Okaan, RN. Pt currently presents with a flat affect and restless, fidgety behavior. Pt supported emotionally and encouraged to express concerns and questions. Pt's safety ensured with 15 minute checks. Pt currently denies SI/HI and A/V hallucinations. Pt verbally agrees to seek staff if SI/HI or A/VH occurs and to consult with staff before acting on any harmful thoughts. Reports insomnia and increased anxiety. See MAR. Will continue POC.

## 2017-01-18 NOTE — Progress Notes (Addendum)
LCSW following for disposition:  [facility]:  Inpatient Psych recommendations   [facility] to [facility] for possible admission and bed status. [facility] to define [facility] and if patient can admit this evening. Sheriff when bed available if patient leaves city limits and if in city limits patient can go by [employer].  L[facility]. MD has been paged to discuss discharge. 4:23 PM  MD is agreeable to discharge this evening as patient is medically stable. DC summary completed. Awaiting BH bed assignment.     Deretha EmoryHannah Lilas Diefendorf LCSW, MSW Clinical Social Work: Optician, dispensingystem Wide Float Coverage for :  [phone]

## 2017-01-18 NOTE — Progress Notes (Signed)
Discussed with SW regarding timing of bed placement.  She does not think the patient will get a bed today.  [facility].  If pt to discharge in am, please call PCCM back and I will complete the pended discharge.     Canary BrimBrandi Mckinnley Smithey, NP-C [facility] Pgr: [phone] or if no answer [phone] 01/18/2017, 1:32 PM

## 2017-01-19 DIAGNOSIS — Z560 Unemployment, unspecified: Secondary | ICD-10-CM

## 2017-01-19 DIAGNOSIS — F314 Bipolar disorder, current episode depressed, severe, without psychotic features: Principal | ICD-10-CM

## 2017-01-19 DIAGNOSIS — Z590 Homelessness: Secondary | ICD-10-CM

## 2017-01-19 DIAGNOSIS — T43632A Poisoning by methylphenidate, intentional self-harm, initial encounter: Secondary | ICD-10-CM

## 2017-01-19 DIAGNOSIS — Z6379 Other stressful life events affecting family and household: Secondary | ICD-10-CM

## 2017-01-19 DIAGNOSIS — F1910 Other psychoactive substance abuse, uncomplicated: Secondary | ICD-10-CM

## 2017-01-19 DIAGNOSIS — T461X2A Poisoning by calcium-channel blockers, intentional self-harm, initial encounter: Secondary | ICD-10-CM

## 2017-01-19 DIAGNOSIS — Z813 Family history of other psychoactive substance abuse and dependence: Secondary | ICD-10-CM

## 2017-01-19 DIAGNOSIS — F17210 Nicotine dependence, cigarettes, uncomplicated: Secondary | ICD-10-CM

## 2017-01-19 DIAGNOSIS — Z818 Family history of other mental and behavioral disorders: Secondary | ICD-10-CM

## 2017-01-19 DIAGNOSIS — T1491XA Suicide attempt, initial encounter: Secondary | ICD-10-CM

## 2017-01-19 MED ORDER — CARBAMAZEPINE 200 MG PO TABS
200.0000 mg | ORAL_TABLET | Freq: Three times a day (TID) | ORAL | Status: DC
  Administered 2017-01-19 – 2017-01-20 (×2): 200 mg via ORAL
  Filled 2017-01-19 (×9): qty 1

## 2017-01-19 MED ORDER — OLANZAPINE 10 MG PO TABS
10.0000 mg | ORAL_TABLET | Freq: Every day | ORAL | Status: DC
  Administered 2017-01-19 – 2017-01-25 (×7): 10 mg via ORAL
  Filled 2017-01-19 (×8): qty 1
  Filled 2017-01-19: qty 7
  Filled 2017-01-19: qty 1

## 2017-01-19 NOTE — Progress Notes (Signed)
Patient attended AA group meeting tonight.  

## 2017-01-19 NOTE — Progress Notes (Signed)
Nutrition Brief Note  Patient identified on the Malnutrition Screening Tool (MST) Report  Wt is stable. Pt eating 100% in ED.   Wt Readings from Last 15 Encounters:  01/18/17 138 lb (62.6 kg)  01/18/17 137 lb 5.6 oz (62.3 kg)  11/27/16 133 lb (60.3 kg)  04/13/16 139 lb (63 kg)  04/11/16 136 lb (61.7 kg)  07/04/11 136 lb 11 oz (62 kg) (46 %, Z= -0.10)*  06/30/11 111 lb (50.3 kg) (7 %, Z= -1.47)*   * Growth percentiles are based on CDC 2-20 Years data.    Body mass index is 18.72 kg/m. Patient meets criteria for normal based on current BMI.  Labs and medications reviewed.   No nutrition interventions warranted at this time. If nutrition issues arise, please consult RD.   Dustin FrancoLindsey Gust Eugene, MS, RD, LDN [facility] Clinical Dietitian Pager: [phone] After Hours Pager: [phone]

## 2017-01-19 NOTE — [facility] Group Notes (Signed)
LCSW Group Therapy Note  01/19/2017 1:15pm  Type of Therapy/Topic:  Group Therapy:  Feelings about Diagnosis  Participation Level:  Active   Description of Group:   This group will allow patients to explore their thoughts and feelings about diagnoses they have received. Patients will be guided to explore their level of understanding and acceptance of these diagnoses. Facilitator will encourage patients to process their thoughts and feelings about the reactions of others to their diagnosis and will guide patients in identifying ways to discuss their diagnosis with significant others in their lives. This group will be process-oriented, with patients participating in exploration of their own experiences, giving and receiving support, and processing challenge from other group members.   Therapeutic Goals: 1. Patient will demonstrate understanding of diagnosis as evidenced by identifying two or more symptoms of the disorder 2. Patient will be able to express two feelings regarding the diagnosis 3. Patient will demonstrate their ability to communicate their needs through discussion and/or role play  Summary of Patient Progress:  Dustin Owens aka Alex was attentive and engaged during group. He shared that he did not know his formal diagnosis but assumes he has an anxiety disorder complicated by increased drug use. Dustin Owens was increasingly anxious during group and had difficulty articulating his thoughts due to his anxiety. Dustin Owens is hoping to get stable through a combination of medication management and learning effective coping skills to deal with the loss of his girlfriend/turmoil of this relationship. He continues to show good insight and progress in the group setting.      Therapeutic Modalities:   Cognitive Behavioral Therapy Brief Therapy Feelings Identification    Ledell PeoplesHeather N Smart, LCSW 01/19/2017 11:40 AM

## 2017-01-19 NOTE — Progress Notes (Signed)
Nursing Progress Note: 7p-7a D: Pt currently presents with a hyperverbal/silly/sarcastic affect and behavior. Pt states "Sometimes I lie ad nosium to see how long people can believe me. I am always so hungry and I never get any food ." Interacting appropriately with the milieu. Pt reports fair sleep during the previous night with current medication regimen. Pt did attend wrap-up group.  A: Pt provided with medications per providers orders. Pt's labs and vitals were monitored throughout the night. Pt supported emotionally and encouraged to express concerns and questions. Pt educated on medications.  R: Pt's safety ensured with 15 minute and environmental checks. Pt currently denies SI, HI, and AVH. Pt verbally contracts to seek staff if SI,HI, or AVH occurs and to consult with staff before acting on any harmful thoughts. Will continue to monitor.

## 2017-01-19 NOTE — [facility] Suicide Risk Assessment (Signed)
[facility]   Nursing information obtained from:    Demographic factors:    Current Mental Status:    Loss Factors:    Historical Factors:    Risk Reduction Factors:     Total Time spent with patient: 30 minutes Principal Problem: <principal problem not specified> Diagnosis:   Patient Active Problem List   Diagnosis Date Noted  . Polysubstance abuse (HCC) [F19.10] 01/18/2017  . Overdose, drug, intentional self-harm, initial encounter (HCC) [T50.902A] 01/16/2017  . Substance induced mood disorder (HCC) [F19.94] 11/28/2016  . Bipolar 1 disorder (HCC) [F31.9] 11/27/2016  . Bipolar affective disorder, depressed, severe (HCC) [F31.4] 04/14/2016  . Suicide attempt by drug ingestion (HCC) [T50.902A]   . Cocaine abuse (HCC) [F14.10] 04/12/2016  . Severe recurrent major depression with psychotic features (HCC) [F33.3] 07/01/2011  . 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 Polysubstance dependence [facility](HCC) [F19.20] 07/01/2011   Subjective Data:  21 y.o  Caucasian male, divorced, homeless, unemployed. Background history of Bipolar Disorder and SUD. Recently discharged from our unit. Presented to the ER via EMS. Overdosed on Methylphenidate and Amlodipine. Was admitted medically for a couple of days.  Patient has had multiple serious attempts. In the past two months has attempted twice. He lost his license, job, housing and marriage. He has not been able to see his children. He has been using a lot of substances. He tends to act impulsively. He has agreed to treatment plan.   Continued Clinical Symptoms:  Alcohol Use Disorder Identification Test Final Score (AUDIT): 10 The "Alcohol Use Disorders Identification Test", Guidelines for Use in Primary Care, Second Edition.  World Science writerHealth Organization [facility](WHO). Score between 0-7:  no or low risk or alcohol related problems. Score between 8-15:  moderate risk of alcohol related problems. Score between 16-19:   high risk of alcohol related problems. Score 20 or above:  warrants further diagnostic evaluation for alcohol dependence and treatment.   CLINICAL FACTORS:   Bipolar Disorder:   Depressive phase Alcohol/Substance Abuse/Dependencies   Musculoskeletal: Strength & Muscle Tone: within normal limits Gait & Station: normal Patient leans: N/A  Psychiatric Specialty Exam: Physical Exam   ROS   Blood pressure (!) 105/53, pulse (!) 110, temperature 97.7 F (36.5 C), temperature source Oral, resp. rate 16, height 6' (1.829 m), weight 62.6 kg (138 lb).Body mass index is 18.72 kg/m.  General Appearance: As in H&P  Eye Contact:  As in H&P  Speech:  As in H&P  Volume:  As in H&P  Mood:  As in H&P  Affect:  As in H&P  Thought Process:  As in H&P  Orientation:  As in H&P  Thought Content:  As in H&P  Suicidal Thoughts:  As in H&P  Homicidal Thoughts:  As in H&P  Memory:  As in H&P  Judgement:  As in H&P  Insight:  As in H&P  Psychomotor Activity:  As in H&P  Concentration:  As in H&P  Recall:  As in H&P  Fund of Knowledge:  As in H&P  Language:  As in H&P  Akathisia:  As in H&P  Handed:    AIMS (if indicated):     Assets:  As in H&P  ADL's:  As in H&P  Cognition:  As in H&P  Sleep:  Number of Hours: 6.25      COGNITIVE FEATURES THAT CONTRIBUTE TO RISK:  Closed-mindedness and Loss of executive function    SUICIDE RISK:   Severe:  Frequent, intense,  and enduring suicidal ideation, specific plan, no subjective intent, but some objective markers of intent (i.e., choice of lethal method), the method is accessible, some limited preparatory behavior, evidence of impaired self-control, severe dysphoria/symptomatology, multiple risk factors present, and few if any protective factors, particularly a lack of social support.  PLAN OF CARE:  As in H&P  I certify that inpatient services furnished can reasonably be expected to improve the patient's condition.   Georgiann Cocker,  MD 01/19/2017, 5:41 PM

## 2017-01-19 NOTE — B[facility] Suicide Risk Assessment (Signed)
B[facility] INPATIENT:  Family/Significant Other Suicide Prevention Education  Suicide Prevention Education:  Patient Refusal for Family/Significant Other Suicide Prevention Education: The patient Dustin Owens has refused to provide written consent for family/significant other to be provided Family/Significant Other Suicide Prevention Education during admission and/or prior to discharge.  Physician notified.  SPE completed with pt, as pt refused to consent to family contact. SPI pamphlet provided to pt and pt was encouraged to share information with support network, ask questions, and talk about any concerns relating to SPE. Pt denies access to guns/firearms and verbalized understanding of information provided. Mobile Crisis information also provided to pt.    Samanth Mirkin N Smart LCSW 01/19/2017, 3:46 PM
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
## 2017-01-19 NOTE — Progress Notes (Signed)
Recreation Therapy Notes  Animal-Assisted Activity (AAA) Program Checklist/Progress Notes Patient Eligibility Criteria Checklist & Daily Group note for Rec TxIntervention  Date: 10.23.2018 Time: 2:45pm Location: Adult Unit   AAA/T Program Assumption of Risk Form signed by Patient/ or Parent Legal Guardian No  Behavioral Response: Did not attend. Patient declined AAA/T services at admission.   Marykay Lexenise L Kaisha Wachob, LRT/CTRS        Jearl KlinefelterBlanchfield, Oreoluwa Aigner L 01/19/2017 3:44 PM

## 2017-01-19 NOTE — Progress Notes (Signed)
D: Pt presents with a flat affect and depressed mood. Pt reported feeling increasingly depressed today. Pt rates anxiety 5/10. Pt denies SI. Pt reported difficulty sleeping last night and stated that he had to take an additional 50 mg to help fall asleep. Per pt, he did not fall asleep until 4 am this morning and feels tired. Pt denies withdrawal symptoms.  A: Orders reviewed. Medications offered to pt as ordered. Verbal support provided. Pt encouraged to attend groups. 15 minute checks performed for safety. R: Pt stated goal "to not feel awful".

## 2017-01-20 DIAGNOSIS — F1994 Other psychoactive substance use, unspecified with psychoactive substance-induced mood disorder: Secondary | ICD-10-CM

## 2017-01-20 MED ORDER — NICOTINE POLACRILEX 2 MG MT GUM
2.0000 mg | CHEWING_GUM | OROMUCOSAL | Status: DC | PRN
  Administered 2017-01-20 – 2017-01-25 (×13): 2 mg via ORAL
  Filled 2017-01-20: qty 1

## 2017-01-20 NOTE — Progress Notes (Signed)
[facility] MD Progress Note  01/20/2017 2:46 PM Dustin Owens  MRN:  [id number] Subjective:   21 y.o  Caucasian male, divorced, homeless, unemployed. Background history of Bipolar Disorder and SUD. Recently discharged from our unit. Presented to the ER via EMS. Overdosed on Methylphenidate and Amlodipine. Was admitted medically for a couple of days.   Chart reviewed today. Patient discussed at team team  Staff reports that he has been very sociable. He has been very impressionistic. He has not voiced any futility thoughts. He has not been observed to be internally stimulated.   Seen today. Has been very sociable. Was in a group just before interview. Says he is tired of doing the same thing over and over. Talked about a [facility] in [place]. Says he wants to do a six months program there. Encouraged to consider rehab. Denies any violent thoughts. Denies any current suicidal thoughts. Denies any homicidal thoughts. No evidence of psychosis. Not depressed.   Principal Problem: Bipolar affective disorder, depressed, severe (HCC) Diagnosis:   Patient Active Problem List   Diagnosis Date Noted  . Polysubstance abuse (HCC) [F19.10] 01/18/2017  . Overdose, drug, intentional self-harm, initial encounter (HCC) [T50.902A] 01/16/2017  . Substance induced mood disorder (HCC) [F19.94] 11/28/2016  . Bipolar 1 disorder (HCC) [F31.9] 11/27/2016  . Bipolar affective disorder, depressed, severe (HCC) [F31.4] 04/14/2016  . Suicide attempt by drug ingestion (HCC) [T50.902A]   . Cocaine abuse (HCC) [F14.10] 04/12/2016  . Severe recurrent major depression with psychotic features (HCC) [F33.3] 07/01/2011  . 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 Polysubstance dependence (HCC) [F19.20] 07/01/2011   Total Time spent with patient: 20 minutes  Past Psychiatric History: As in H&P  Past Medical History:  Past Medical History:  Diagnosis Date  . Allergy   . Anxiety   . Depression   .  Headache(784.0)   . Vision abnormalities     Past Surgical History:  Procedure Laterality Date  . NO PAST SURGERIES     Family History:  Family History  Problem Relation Age of Onset  . OCD Mother   . Drug abuse Mother   . Schizophrenia Father   . Drug abuse Father    Family Psychiatric  History: As in H&P Social History:  History  Alcohol Use  . 0.5 oz/week  . 1 Standard drinks or equivalent per week    Comment: 1 - 1/2 5ths of bourbon     History  Drug Use  . Frequency: 7.0 times per week  . Types: Amphetamines, Benzodiazepines, Cocaine, Codeine, Fentanyl, Hashish, Heroin, Hydrocodone, Hydromorphone, LSD, Marijuana, MDMA (Ecstacy), Methamphetamines, Morphine, Nitrous oxide, Opium, PCP, Solvent inhalants    Social History   Social History  . Marital status: Single    Spouse name: N/A  . Number of children: N/A  . Years of education: N/A   Occupational History  . student     11th grade at a [employer] facility   Social History Main Topics  . Smoking status: Current Every Day Smoker    Packs/day: 1.00    Years: 5.00    Types: Cigarettes  . Smokeless tobacco: Never Used  . Alcohol use 0.5 oz/week    1 Standard drinks or equivalent per week     Comment: 1 - 1/2 5ths of bourbon  . Drug use: Yes    Frequency: 7.0 times per week    Types: Amphetamines, Benzodiazepines, Cocaine, Codeine, Fentanyl, Hashish, Heroin, Hydrocodone, Hydromorphone, LSD, Marijuana, MDMA (Ecstacy), Methamphetamines, Morphine, Nitrous oxide,  Opium, PCP, Solvent inhalants  . Sexual activity: Yes    Partners: Female    Birth control/ protection: Condom   Other Topics Concern  . None   Social History Narrative  . None   Additional Social History:    Pain Medications: Pt is positive for opiates.  Patient admits to taking a vicodin yesterday (08/30). Prescriptions: None Over the Counter: Benedryl occasionally. History of alcohol / drug use?: Yes Longest period of sobriety (when/how long): 2  weeks Negative Consequences of Use: Legal Withdrawal Symptoms: Agitation, Aggressive/Assaultive, Fever / Chills, Irritability, Sweats, Tingling Sleep: Fair  Appetite:  Fair  Current Medications: Current Facility-Administered Medications  Medication Dose Route Frequency Provider Last Rate Last Dose  . alum & mag hydroxide-simeth (MAALOX/MYLANTA) 200-200-20 MG/5ML suspension 30 mL  30 mL Oral Q4H PRN Fransisca Kaufmannavis, Laura A, NP      . carbamazepine (TEGRETOL) tablet 200 mg  200 mg Oral TID Georgiann CockerIzediuno, Toshiye Kever A, MD   200 mg at 01/20/17 1104  . feeding supplement (ENSURE ENLIVE) (ENSURE ENLIVE) liquid 237 mL  237 mL Oral BID BM Simon, Spencer E, PA-C   237 mL at 01/20/17 1418  . magnesium hydroxide (MILK OF MAGNESIA) suspension 30 mL  30 mL Oral Daily PRN Fransisca Kaufmannavis, Laura A, NP      . nicotine polacrilex (NICORETTE) gum 2 mg  2 mg Oral PRN Mikayela Deats, Delight OvensVincent A, MD   2 mg at 01/20/17 1420  . OLANZapine (ZYPREXA) tablet 10 mg  10 mg Oral QHS Jahzir Strohmeier, Delight OvensVincent A, MD   10 mg at 01/19/17 2140  . traZODone (DESYREL) tablet 100 mg  100 mg Oral QHS,MR X 1 Kerry HoughSimon, Spencer E, PA-C   100 mg at 01/19/17 2141    Lab Results: No results found for this or any previous visit (from the past 48 hour(s)).  Blood Alcohol level:  Lab Results  Component Value Date   ETH <10 01/15/2017   ETH 45 (H) 11/26/2016    Metabolic Disorder Labs: Lab Results  Component Value Date   HGBA1C 4.9 12/02/2016   MPG 93.93 12/02/2016   Lab Results  Component Value Date   PROLACTIN 32.5 (H) 12/02/2016   Lab Results  Component Value Date   CHOL 146 12/02/2016   TRIG 102 12/02/2016   HDL 48 12/02/2016   CHOLHDL 3.0 12/02/2016   VLDL 20 12/02/2016   LDLCALC 78 12/02/2016    Physical Findings: AIMS: Facial and Oral Movements Muscles of Facial Expression: None, normal Lips and Perioral Area: None, normal Jaw: None, normal Tongue: None, normal,Extremity Movements Upper (arms, wrists, hands, fingers): None, normal Lower (legs,  knees, ankles, toes): None, normal, Trunk Movements Neck, shoulders, hips: None, normal, Overall Severity Severity of abnormal movements (highest score from questions above): None, normal Incapacitation due to abnormal movements: None, normal Patient's awareness of abnormal movements (rate only patient's report): No Awareness, Dental Status Current problems with teeth and/or dentures?: No Does patient usually wear dentures?: No  CIWA:    COWS:  COWS Total Score: 8  Musculoskeletal: Strength & Muscle Tone: within normal limits Gait & Station: normal Patient leans: N/A  Psychiatric Specialty Exam: Physical Exam  Constitutional: He appears well-developed and well-nourished.  HENT:  Head: Normocephalic and atraumatic.  Respiratory: Effort normal.  Psychiatric:  As above    ROS  Blood pressure 99/62, pulse (!) 114, temperature 98 F (36.7 C), temperature source Oral, resp. rate 16, height 6' (1.829 m), weight 62.6 kg (138 lb).Body mass index is 18.72 kg/m.  General  Appearance:  Casually dressed, pleasant, engages well. Not internally distracted.   Eye Contact:  Good  Speech:  Spontaneous. Normal tone and rate  Volume:  Normal  Mood:  Euthtymic  Affect:  Full range   Thought Process:  Linear  Orientation:  WNL  Thought Content:  Now considering chemical dependency treatment. No delusional theme. No preoccupation with violent thoughts.  No hallucination in any modality.   Suicidal Thoughts:  None  Homicidal Thoughts:  No  Memory:  WNL  Judgement:  Fair  Insight:  Partial   Psychomotor Activity:  Normal  Concentration:  Fair  Recall: WNL   Fund of Knowledge:  Fair  Language:  Good  Akathisia:  Negative  Handed:    AIMS (if indicated):     Assets:  Physical Health  ADL's:  Impaired  Cognition:  Impaired,  Mild  Sleep:  Number of Hours: 6.25     Treatment Plan Summary:  Patient is not depressed. He is not psychotic. He is superficially pleasant. He remains a high risk  to self. Goal is for chemical dependency treatment.   Psychiatric: Bipolar disorder ,,,, current episode is depression SUD SIMD  Medical:  Psychosocial:  Job loss Loss of access to his kids Homelessness  PLAN: 1. Discontinue Carbamazepine as he does not want it.  2. Continue other medications at current dose 3. Continue to monitor mood, behavior and interaction with peers 4. Continue to encourage unit groups and activities  Georgiann Cocker, MD 01/20/2017, 2:46 PMPatient ID: Dustin Owens, male   DOB: Jul 14, 1995, 20 y.o.   MRN: [id number]

## 2017-01-20 NOTE — [facility] Group Notes (Signed)
LCSW Group Therapy Note  01/20/2017 1:15pm  Type of Therapy and Topic:  Group Therapy: Avoiding Self-Sabotaging and Enabling Behaviors  Participation Level:  Active   Description of Group:   In this group, patients will learn how to identify obstacles, self-sabotaging and enabling behaviors, as well as: what are they, why do we do them and what needs these behaviors meet. Discuss unhealthy relationships and how to have positive healthy boundaries with those that sabotage and enable. Explore aspects of self-sabotage and enabling in yourself and how to limit these self-destructive behaviors in everyday life.   Therapeutic Goals: 1. Patient will identify one obstacle that relates to self-sabotage and enabling behaviors 2. Patient will identify one personal self-sabotaging or enabling behavior they did prior to admission 3. Patient will state a plan to change the above identified behavior 4. Patient will demonstrate ability to communicate their needs through discussion and/or role play.   Summary of Patient Progress:  Trention aka "[name]" was attentive and engaged during today's processing group. He shared that he pities himself, blames others, and over thinks his situation, which are all forms of self sabotage. [name] continues to show progress in the group setting improving insight. [name] shares that he hopes to feel better about himself and increase his self esteem in order to avoid self sabotage and feel like he deserves to be happy.    Therapeutic Modalities:   Cognitive Behavioral Therapy Person-Centered Therapy Motivational Interviewing   Pulte HomesHeather N Smart, LCSW 01/20/2017 2:22 PM

## 2017-01-20 NOTE — Progress Notes (Signed)
Patient attended N/A group meeting tonight. 

## 2017-01-20 NOTE — Progress Notes (Signed)
Recreation Therapy Notes  Date: 01/20/17 Time: 0930 Location: 300 Hall Dayroom  Group Topic: Stress Management  Goal Area(s) Addresses:  Patient will verbalize importance of using healthy stress management.  Patient will identify positive emotions associated with healthy stress management.   Intervention: Stress Management  Activity :  Guided Imagery.  LRT introduced the stress management technique of guided imagery.  Patients were to follow along as LRT read script to engage in the activity.  Education:  Stress Management, Discharge Planning.   Education Outcome: Acknowledges edcuation/In group clarification offered/Needs additional education  Clinical Observations/Feedback:  Pt did not attend group.    Sherlie Boyum, LRT/CTRS         Dustin Owens A 01/20/2017 11:48 AM 

## 2017-01-20 NOTE — Tx Team (Signed)
Interdisciplinary Treatment and Diagnostic Plan Update  01/20/2017 Time of Session: 1610RU MATS JEANLOUIS MRN: [id number]  Principal Diagnosis: Bipolar affective disorder, depressed, severe (HCC)  Secondary Diagnoses: Principal Problem:   Bipolar affective disorder, depressed, severe (HCC) Active Problems:   Polysubstance abuse (HCC)   Current Medications:  Current Facility-Administered Medications  Medication Dose Route Frequency Provider Last Rate Last Dose  . alum & mag hydroxide-simeth (MAALOX/MYLANTA) 200-200-20 MG/5ML suspension 30 mL  30 mL Oral Q4H PRN Fransisca Kaufmann A, NP      . carbamazepine (TEGRETOL) tablet 200 mg  200 mg Oral TID Georgiann Cocker, MD   200 mg at 01/19/17 1814  . feeding supplement (ENSURE ENLIVE) (ENSURE ENLIVE) liquid 237 mL  237 mL Oral BID BM Donell Sievert E, PA-C   237 mL at 01/19/17 1306  . magnesium hydroxide (MILK OF MAGNESIA) suspension 30 mL  30 mL Oral Daily PRN Fransisca Kaufmann A, NP      . nicotine (NICODERM CQ - dosed in mg/24 hours) patch 21 mg  21 mg Transdermal Daily Donell Sievert E, PA-C   21 mg at 01/19/17 1305  . OLANZapine (ZYPREXA) tablet 10 mg  10 mg Oral QHS Izediuno, Delight Ovens, MD   10 mg at 01/19/17 2140  . traZODone (DESYREL) tablet 100 mg  100 mg Oral QHS,MR X 1 Kerry Hough, PA-C   100 mg at 01/19/17 2141   PTA Medications: Prescriptions Prior to Admission  Medication Sig Dispense Refill Last Dose  . folic acid (FOLVITE) 1 MG tablet Take 1 tablet (1 mg total) by mouth daily. 30 tablet 0   . Multiple Vitamin (MULTIVITAMIN WITH MINERALS) TABS tablet Take 1 tablet by mouth daily. 30 tablet 0   . nicotine (NICODERM CQ - DOSED IN MG/24 HOURS) 14 mg/24hr patch Place 1 patch (14 mg total) onto the skin daily. 28 patch 0   . thiamine 100 MG tablet Take 1 tablet (100 mg total) by mouth daily. 30 tablet 0     Patient Stressors: Loss of relationship with ex wife Marital or family conflict  Patient Strengths: Ability for  insight Active sense of humor Physical Health  Treatment Modalities: Medication Management, Group therapy, Case management,  1 to 1 session with clinician, Psychoeducation, Recreational therapy.   Physician Treatment Plan for Primary Diagnosis: Bipolar affective disorder, depressed, severe (HCC) Long Term Goal(s): Improvement in symptoms so as ready for discharge Improvement in symptoms so as ready for discharge   Short Term Goals: Ability to identify changes in lifestyle to reduce recurrence of condition will improve Ability to verbalize feelings will improve Ability to disclose and discuss suicidal ideas Ability to demonstrate self-control will improve Ability to identify and develop effective coping behaviors will improve Ability to maintain clinical measurements within normal limits will improve Compliance with prescribed medications will improve Ability to identify triggers associated with substance abuse/mental health issues will improve Ability to identify changes in lifestyle to reduce recurrence of condition will improve Ability to verbalize feelings will improve Ability to disclose and discuss suicidal ideas Ability to demonstrate self-control will improve Ability to identify and develop effective coping behaviors will improve Ability to maintain clinical measurements within normal limits will improve Compliance with prescribed medications will improve Ability to identify triggers associated with substance abuse/mental health issues will improve  Medication Management: Evaluate patient's response, side effects, and tolerance of medication regimen.  Therapeutic Interventions: 1 to 1 sessions, Unit Group sessions and Medication administration.  Evaluation of Outcomes: Progressing  Physician Treatment Plan for Secondary Diagnosis: Principal Problem:   Bipolar affective disorder, depressed, severe (HCC) Active Problems:   Polysubstance abuse (HCC)  Long Term Goal(s):  Improvement in symptoms so as ready for discharge Improvement in symptoms so as ready for discharge   Short Term Goals: Ability to identify changes in lifestyle to reduce recurrence of condition will improve Ability to verbalize feelings will improve Ability to disclose and discuss suicidal ideas Ability to demonstrate self-control will improve Ability to identify and develop effective coping behaviors will improve Ability to maintain clinical measurements within normal limits will improve Compliance with prescribed medications will improve Ability to identify triggers associated with substance abuse/mental health issues will improve Ability to identify changes in lifestyle to reduce recurrence of condition will improve Ability to verbalize feelings will improve Ability to disclose and discuss suicidal ideas Ability to demonstrate self-control will improve Ability to identify and develop effective coping behaviors will improve Ability to maintain clinical measurements within normal limits will improve Compliance with prescribed medications will improve Ability to identify triggers associated with substance abuse/mental health issues will improve     Medication Management: Evaluate patient's response, side effects, and tolerance of medication regimen.  Therapeutic Interventions: 1 to 1 sessions, Unit Group sessions and Medication administration.  Evaluation of Outcomes: Progressing   RN Treatment Plan for Primary Diagnosis: Bipolar affective disorder, depressed, severe (HCC) Long Term Goal(s): Knowledge of disease and therapeutic regimen to maintain health will improve  Short Term Goals: Ability to verbalize frustration and anger appropriately will improve, Ability to demonstrate self-control, Ability to disclose and discuss suicidal ideas and Ability to identify and develop effective coping behaviors will improve  Medication Management: RN will administer medications as ordered by  provider, will assess and evaluate patient's response and provide education to patient for prescribed medication. RN will report any adverse and/or side effects to prescribing provider.  Therapeutic Interventions: 1 on 1 counseling sessions, Psychoeducation, Medication administration, Evaluate responses to treatment, Monitor vital signs and CBGs as ordered, Perform/monitor CIWA, COWS, AIMS and Fall Risk screenings as ordered, Perform wound care treatments as ordered.  Evaluation of Outcomes: Progressing   LCSW Treatment Plan for Primary Diagnosis: Bipolar affective disorder, depressed, severe (HCC) Long Term Goal(s): Safe transition to appropriate next level of care at discharge, Engage patient in therapeutic group addressing interpersonal concerns.  Short Term Goals: Engage patient in aftercare planning with referrals and resources, Facilitate patient progression through stages of change regarding substance use diagnoses and concerns and Identify triggers associated with mental health/substance abuse issues  Therapeutic Interventions: Assess for all discharge needs, 1 to 1 time with Social worker, Explore available resources and support systems, Assess for adequacy in community support network, Educate family and significant other(s) on suicide prevention, Complete Psychosocial Assessment, Interpersonal group therapy.  Evaluation of Outcomes: Progressing   Progress in Treatment: Attending groups: Yes. Participating in groups: Yes. Taking medication as prescribed: Yes. Toleration medication: Yes. Family/Significant other contact made: SPE completed with pt; pt declined to consent to family contact.  Patient understands diagnosis: Yes. Discussing patient identified problems/goals with staff: Yes. Medical problems stabilized or resolved: Yes. Denies suicidal/homicidal ideation: Yes. Issues/concerns per patient self-inventory: No. Other: n/a   New problem(s) identified: Yes, Describe:  pt  reports that his stress and anxiety are so high that he cannot process his own thoughts; cope with the loss of his relationship, or deal with other life stressors. his main focus is to get stabilized on appropriate anti-anxiety medication.   New  Short Term/Long Term Goal(s): detox, medication management; elimination of SI thoughts/plans; development of comprehensive mental wellness/sobriety plan.   Discharge Plan or Barriers: CSW assessing. Pt planning to return with his aunt temporarily until he can afford to rent a room from a friend. [facility]. Pt has been given MHAG pamphlet and AA/NA list for his county for additional community support.   Reason for Continuation of Hospitalization: Anxiety Depression Medication stabilization Withdrawal symptoms  Estimated Length of Stay: Monday 01/25/17  Attendees: Patient: 01/20/2017 9:05 AM  Physician: Dr. Jackquline BerlinIzediuno MD 01/20/2017 9:05 AM  Nursing: Lorine BearsJan, Christa RN 01/20/2017 9:05 AM  RN Care Manager: Onnie BoerJennifer Clark CM 01/20/2017 9:05 AM  Social Worker: Chartered loss adjusterHeather Smart, LCSW 01/20/2017 9:05 AM  Recreational Therapist: x  01/20/2017 9:05 AM  Other: Armandina StammerAgnes Nwoko NP; Feliz Beamravis Money NP 01/20/2017 9:05 AM  Other:  01/20/2017 9:05 AM  Other: 01/20/2017 9:05 AM    Scribe for Treatment Team: Ledell PeoplesHeather N Smart, LCSW 01/20/2017 9:05 AM

## 2017-01-20 NOTE — Progress Notes (Signed)
At the beginning of the shift, pt was in his room in bed, but not asleep.  He reports that he was feeling tired after dinner and lay down to rest.  He denies SI/HI/AVH.  He denies having any withdrawal symptoms.  He is aware of the medications that he is ordered for this evening, and states he wants to try to sleep without the Trazodone.  He is hoping the Zyprexa will be enough to make him sleepy.  Pt has been pleasant and appropriate this evening.  He was in the dayroom after group until bedtime socializing and watching a ball game on TV.  Support and encouragement offered.  Discharge plans are in process.  He is working with the CSW on getting into a program.  Safety maintained with q15 minute checks.

## 2017-01-20 NOTE — Progress Notes (Signed)
Patient ID: Dustin Owens, male   DOB: 11-27-1995, 21 y.o.   MRN: [id number]  DAR: Pt. Denies SI/HI and A/V Hallucinations. Patient does not report any pain or discomfort at this time. Patient got out of bed around 110 this morning although writer encouraged patient to get up and take morning medications. MD Izediuno notified of patient not getting out of bed and his elevated pulse. Support and encouragement provided to the patient. Patient is seen in the milieu as the morning progresses. He is seen interacting with his peers and is cooperative with staff at this time. Patient reports his pulse has been high this morning because he has been, "laying in bed thinking of some fucked up shit."  He reports that he would like to stop taking Tegretol because he feels it does not help. He reports that he does not like his nicotine patch because it has irritated his skin. Patient was switched to nicotine gum and is utilizing this order. Patient did not turn in his daily inventory sheet today. Q15 minute checks are maintained for safety.

## 2017-01-21 LAB — CULTURE, BLOOD (ROUTINE X 2)
CULTURE: NO GROWTH
CULTURE: NO GROWTH
SPECIAL REQUESTS: ADEQUATE
Special Requests: ADEQUATE

## 2017-01-21 NOTE — Progress Notes (Signed)
D:Pt has been in the bed most of the morning. Pt reports that he feels "achey" all over. Pt's temp 98.0. No other symptoms observed or expressed. Pt says that he still feels hopeless and depressed but has no suicidal thoughts today.  A:Offered support, encouragement and 15 minute checks. R:Safety maintained on the unit.

## 2017-01-21 NOTE — Progress Notes (Signed)
Adult Psychoeducational Group Note  Date:  01/21/2017 Time:  9:05 PM  Group Topic/Focus:  Wrap-Up Group:   The focus of this group is to help patients review their daily goal of treatment and discuss progress on daily workbooks.  Participation Level:  Active  Participation Quality:  Appropriate  Affect:  Appropriate  Cognitive:  Alert  Insight: Appropriate  Engagement in Group:  Engaged  Modes of Intervention:  Discussion  Additional Comments:  Patient stated having a wonderful day. Patient's goal for today was to not get upset. Patient met goal.  Xara Paulding L Yareth Macdonnell 01/21/2017, 9:05 PM

## 2017-01-21 NOTE — Progress Notes (Signed)
[facility] MD Progress Note  01/21/2017 10:13 AM Dustin Owens  MRN:  [id number] Subjective:   21 y.o  Caucasian male, divorced, homeless, unemployed. Background history of Bipolar Disorder and SUD. Recently discharged from our unit. Presented to the ER via EMS. Overdosed on Methylphenidate and Amlodipine. Was admitted medically for a couple of days.   Chart reviewed today. Patient discussed at team today. He is processing aftercare. Wants to be able to work and pay child support. Cannot do that if he is in rehab. Focused on getting into a Halfway home in [place]. We plan to involve his family more.   Staff reports he remains pleasant. He has been participating at unit groups and activities. No behavioral issues. He has not voiced any futility thoughts. He has not been observed to be responding to internal stimuli.   Seen today. Says he is feeling a bit down today. He has been dreaming about his kids. Says he had stayed in bed most of the morning as he does not want to project his anger out. Denies any suicidal thoughts. Denies any thoughts of violence. I reviewed benefits of Carbamazepine again with him. He wants to stay on Olanzapine alone and reassess by tomorrow. No evidence of psychosis.   Principal Problem: Bipolar affective disorder, depressed, severe (HCC) Diagnosis:   Patient Active Problem List   Diagnosis Date Noted  . Polysubstance abuse (HCC) [F19.10] 01/18/2017  . Overdose, drug, intentional self-harm, initial encounter (HCC) [T50.902A] 01/16/2017  . Substance induced mood disorder (HCC) [F19.94] 11/28/2016  . Bipolar 1 disorder (HCC) [F31.9] 11/27/2016  . Bipolar affective disorder, depressed, severe (HCC) [F31.4] 04/14/2016  . Suicide attempt by drug ingestion (HCC) [T50.902A]   . Cocaine abuse (HCC) [F14.10] 04/12/2016  . Severe recurrent major depression with psychotic features (HCC) [F33.3] 07/01/2011  . 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 Polysubstance  dependence (HCC) [F19.20] 07/01/2011   Total Time spent with patient: 20 minutes  Past Psychiatric History: As in H&P  Past Medical History:  Past Medical History:  Diagnosis Date  . Allergy   . Anxiety   . Depression   . Headache(784.0)   . Vision abnormalities     Past Surgical History:  Procedure Laterality Date  . NO PAST SURGERIES     Family History:  Family History  Problem Relation Age of Onset  . OCD Mother   . Drug abuse Mother   . Schizophrenia Father   . Drug abuse Father    Family Psychiatric  History: As in H&P Social History:  History  Alcohol Use  . 0.5 oz/week  . 1 Standard drinks or equivalent per week    Comment: 1 - 1/2 5ths of bourbon     History  Drug Use  . Frequency: 7.0 times per week  . Types: Amphetamines, Benzodiazepines, Cocaine, Codeine, Fentanyl, Hashish, Heroin, Hydrocodone, Hydromorphone, LSD, Marijuana, MDMA (Ecstacy), Methamphetamines, Morphine, Nitrous oxide, Opium, PCP, Solvent inhalants    Social History   Social History  . Marital status: Single    Spouse name: N/A  . Number of children: N/A  . Years of education: N/A   Occupational History  . student     11th grade at a [employer] facility   Social History Main Topics  . Smoking status: Current Every Day Smoker    Packs/day: 1.00    Years: 5.00    Types: Cigarettes  . Smokeless tobacco: Never Used  . Alcohol use 0.5 oz/week    1 Standard drinks  or equivalent per week     Comment: 1 - 1/2 5ths of bourbon  . Drug use: Yes    Frequency: 7.0 times per week    Types: Amphetamines, Benzodiazepines, Cocaine, Codeine, Fentanyl, Hashish, Heroin, Hydrocodone, Hydromorphone, LSD, Marijuana, MDMA (Ecstacy), Methamphetamines, Morphine, Nitrous oxide, Opium, PCP, Solvent inhalants  . Sexual activity: Yes    Partners: Female    Birth control/ protection: Condom   Other Topics Concern  . None   Social History Narrative  . None   Additional Social History:    Pain  Medications: Pt is positive for opiates.  Patient admits to taking a vicodin yesterday (08/30). Prescriptions: None Over the Counter: Benedryl occasionally. History of alcohol / drug use?: Yes Longest period of sobriety (when/how long): 2 weeks Negative Consequences of Use: Legal Withdrawal Symptoms: Agitation, Aggressive/Assaultive, Fever / Chills, Irritability, Sweats, Tingling Sleep: Fair  Appetite:  Fair  Current Medications: Current Facility-Administered Medications  Medication Dose Route Frequency Provider Last Rate Last Dose  . alum & mag hydroxide-simeth (MAALOX/MYLANTA) 200-200-20 MG/5ML suspension 30 mL  30 mL Oral Q4H PRN Fransisca Kaufmannavis, Laura A, NP      . feeding supplement (ENSURE ENLIVE) (ENSURE ENLIVE) liquid 237 mL  237 mL Oral BID BM Donell SievertSimon, Spencer E, PA-C   237 mL at 01/21/17 0943  . magnesium hydroxide (MILK OF MAGNESIA) suspension 30 mL  30 mL Oral Daily PRN Fransisca Kaufmannavis, Laura A, NP      . nicotine polacrilex (NICORETTE) gum 2 mg  2 mg Oral PRN Georgiann CockerIzediuno, Vincent A, MD   2 mg at 01/20/17 2131  . OLANZapine (ZYPREXA) tablet 10 mg  10 mg Oral QHS Izediuno, Delight OvensVincent A, MD   10 mg at 01/20/17 2131  . traZODone (DESYREL) tablet 100 mg  100 mg Oral QHS,MR X 1 Kerry HoughSimon, Spencer E, PA-C   100 mg at 01/19/17 2141    Lab Results: No results found for this or any previous visit (from the past 48 hour(s)).  Blood Alcohol level:  Lab Results  Component Value Date   ETH <10 01/15/2017   ETH 45 (H) 11/26/2016    Metabolic Disorder Labs: Lab Results  Component Value Date   HGBA1C 4.9 12/02/2016   MPG 93.93 12/02/2016   Lab Results  Component Value Date   PROLACTIN 32.5 (H) 12/02/2016   Lab Results  Component Value Date   CHOL 146 12/02/2016   TRIG 102 12/02/2016   HDL 48 12/02/2016   CHOLHDL 3.0 12/02/2016   VLDL 20 12/02/2016   LDLCALC 78 12/02/2016    Physical Findings: AIMS: Facial and Oral Movements Muscles of Facial Expression: None, normal Lips and Perioral Area: None,  normal Jaw: None, normal Tongue: None, normal,Extremity Movements Upper (arms, wrists, hands, fingers): None, normal Lower (legs, knees, ankles, toes): None, normal, Trunk Movements Neck, shoulders, hips: None, normal, Overall Severity Severity of abnormal movements (highest score from questions above): None, normal Incapacitation due to abnormal movements: None, normal Patient's awareness of abnormal movements (rate only patient's report): No Awareness, Dental Status Current problems with teeth and/or dentures?: No Does patient usually wear dentures?: No  CIWA:    COWS:  COWS Total Score: 8  Musculoskeletal: Strength & Muscle Tone: within normal limits Gait & Station: normal Patient leans: N/A  Psychiatric Specialty Exam: Physical Exam  Constitutional: He appears well-developed and well-nourished.  HENT:  Head: Normocephalic and atraumatic.  Respiratory: Effort normal.  Psychiatric:  As above    ROS  Blood pressure 112/73, pulse 75, temperature 98.4  F (36.9 C), temperature source Oral, resp. rate 16, height 6' (1.829 m), weight 62.6 kg (138 lb).Body mass index is 18.72 kg/m.  General Appearance:  Casually dressed, just got out of bed. Not as bubbly as he used to be. Superficial and dismissive  Eye Contact:  Good  Speech:  Spontaneous. Normal tone and rate  Volume:  Normal  Mood:  Some underlying irritability  Affect:  Restricted  Thought Process:  Linear  Orientation:  WNL  Thought Content:  . No delusional theme. No preoccupation with violent thoughts.  No hallucination in any modality.   Suicidal Thoughts:  None  Homicidal Thoughts:  No  Memory: Unable to assess at this time.   Judgement:  Fair  Insight:  Partial   Psychomotor Activity:  Normal  Concentration:  Fair  Recall: WNL   Fund of Knowledge:  Fair  Language:  Good  Akathisia:  Negative  Handed:    AIMS (if indicated):     Assets:  Physical Health  ADL's:  Impaired  Cognition:  Impaired,  Mild   Sleep:  Number of Hours: 6.5     Treatment Plan Summary:  Patient is beginning to process his psychosocial issues more. Some withdrawal and irritability today. Benefit of combination of Olanzapine and Tegretol reinstated to patient. Patient wants to give it another day. We would evaluate him further.   Psychiatric: Bipolar disorder ,,,, current episode is depression SUD SIMD  Medical:  Psychosocial:  Job loss Loss of access to his kids Homelessness  PLAN: 1. Continue current regimen 2. Continue to monitor mood, behavior and interaction with peers    Georgiann Cocker, MD 01/21/2017, 10:13 AMPatient ID: Dustin Owens, male   DOB: 1995/08/26, 21 y.o.   MRN: [id number] Patient ID: Dustin Owens, male   DOB: [birth year], 21 y.o.   MRN: [id number]

## 2017-01-21 NOTE — [facility] Group Notes (Signed)
LCSW Group Therapy Note  01/21/2017 1:15pm  Type of Therapy/Topic:  Group Therapy:  Emotion Regulation  Participation Level:  Active   Description of Group:   The purpose of this group is to assist patients in learning to regulate negative emotions and experience positive emotions. Patients will be guided to discuss ways in which they have been vulnerable to their negative emotions. These vulnerabilities will be juxtaposed with experiences of positive emotions or situations, and patients will be challenged to use positive emotions to combat negative ones. Special emphasis will be placed on coping with negative emotions in conflict situations, and patients will process healthy conflict resolution skills.  Therapeutic Goals: 1. Patient will identify two positive emotions or experiences to reflect on in order to balance out negative emotions 2. Patient will label two or more emotions that they find the most difficult to experience 3. Patient will demonstrate positive conflict resolution skills through discussion and/or role plays  Summary of Patient Progress:  Dustin Owens was attentive and engaged during today's processing group. He shared that his main obstacle is finding a safe place to live where he can maintain sobriety. Dustin Owens has been exploring halfway houses, [facility], and [facility] [place]. He was receptive to feedback and information from CSW and other patients. He presents with high anxiety, mildly sarcastic, and tries to make other laugh. He is easily redirectable.     Therapeutic Modalities:   Cognitive Behavioral Therapy Feelings Identification Dialectical Behavioral Therapy   Ledell PeoplesHeather N Smart, LCSW 01/21/2017 11:25 AM

## 2017-01-22 NOTE — B[facility] Group Notes (Addendum)
LCSW Group Therapy Note  01/22/2017 1:15pm  Type of Therapy and Topic:  Group Therapy:  Feelings around Relapse and Recovery  Participation Level:  Active  Description of Group:    Patients in this group will discuss emotions they experience before and after a relapse. They will process how experiencing these feelings, or avoidance of experiencing them, relates to having a relapse. Facilitator will guide patients to explore emotions they have related to recovery. Patients will be encouraged to process which emotions are more powerful. They will be guided to discuss the emotional reaction significant others in their lives may have to their relapse or recovery. Patients will be assisted in exploring ways to respond to the emotions of others without this contributing to a relapse.  Therapeutic Goals: 1. Patient will identify two or more emotions that lead to a relapse for them 2. Patient will identify two emotions that result when they relapse 3. Patient will identify two emotions related to recovery 4. Patient will demonstrate ability to communicate their needs through discussion and/or role plays   Summary of Patient Progress:  Active and engaged in group, stated "its so helpful to know that im not just f---ed up" when learning that long term use of amphetamines can produce sustained anhedonia.  Admits that he is frustrated w himself "I have lost everything", relapsed after 5 year period of sobriety.  Identified w the physical and psychological challenges of sustained recovery. "I need to not go home, I need to fix the car so I can go to meetings - I know where they are, I just need to go."    Therapeutic Modalities:   Cognitive Behavioral Therapy Solution-Focused Therapy Assertiveness Training Relapse Prevention Therapy   Sallee Langenne C Alayia Meggison, LCSW 01/22/2017 2:53 PM

## 2017-01-22 NOTE — Progress Notes (Signed)
Recreation Therapy Notes  Date: 01/22/17 Time: 0930 Location: [address] Dayroom  Group Topic: Stress Management  Goal Area(s) Addresses:  Patient will verbalize importance of using healthy stress management.  Patient will identify positive emotions associated with healthy stress management.   Behavioral Response: Engaged  Intervention: Stress Management  Activity :  LRT introduced the stress management technique of meditation.  LRT played a meditation from the Calm app that allowed patients to examine the process of humanity and how we interact with each other.  Patients were to follow along as the meditation played to fully engage in the technique.  Education:  Stress Management, Discharge Planning.   Education Outcome: Acknowledges edcuation/In group clarification offered/Needs additional education  Clinical Observations/Feedback: Pt attended group.   Caroll RancherMarjette Jacinda Kanady, LRT/CTRS         Caroll RancherLindsay, Tata Timmins A 01/22/2017 12:49 PM

## 2017-01-22 NOTE — Progress Notes (Signed)
Data. Patient denies SI/HI/AVH. Verbally contracts for safety on the unit and to come to staff before acting of any self harm thoughts/feelings.  Patient interacting well with staff and other patients. Patient has been loud and has been using frequent cuss words. Patient was redirected a number of times, but stated, "It's just who I am. I can't stop." Affect has been bright and he has been noted throughout the day laughingand joking with peers. Action. Emotional support and encouragement offered. Education provided on medication, indications and side effect. Q 15 minute checks done for safety. Response. Safety on the unit maintained through 15 minute checks.  Medications taken as prescribed. Attended groups. Remained calm and appropriate through out shift.

## 2017-01-22 NOTE — Progress Notes (Signed)
[facility] MD Progress Note  01/22/2017 11:14 AM CHIN WACHTER  MRN:  [id number] Subjective:   21 y.o  Caucasian male, divorced, homeless, unemployed. Background history of Bipolar Disorder and SUD. Recently discharged from our unit. Presented to the ER via EMS. Overdosed on Methylphenidate and Amlodipine. Was admitted medically for a couple of days.   Chart reviewed today. Patient discussed at team today. SW plans to collaborate his reported support from his grand-mother.   Staff reports he participated at groups. He has expressed desire to stay positive and focused on getting well. He has not voiced any suicidal thoughts. Slept well last night. He has been eating his meals. He has not been observed to be withdrawn. No PRN medication lately.   Seen today. Tells me that his ex-wife has taken out a restraining order against him. She has secured temporary custody of their children. Says that hurts him a lot as he can no longer see them. Says his daughter runs away from him as she cannot identify him. Says he has been thinking a lot lately. He spoke with his maternal grand-mother who is willing to support him. Says his grand-mother lives in [place]. He plans to move over there and get his life back on track. Says that way he is away from the people and places that makes him use. We explored chemical dependency treatment there. Patient says he would attend meetings and probably do outpatient there. Tells me he is not suicidal. Says suicidal attempts has been a cry for help. Patient understands that he could have accidentally killed himself in the process. We explored how it would have affected his family. Patient agrees at tells me he just has to do things differently.  He is tolerating his medications well. No evidence of depression. No evidence of mania. No evidence of psychosis. No cravings.   Principal Problem: Bipolar affective disorder, depressed, severe (HCC) Diagnosis:   Patient Active Problem List    Diagnosis Date Noted  . Polysubstance abuse (HCC) [F19.10] 01/18/2017  . Overdose, drug, intentional self-harm, initial encounter (HCC) [T50.902A] 01/16/2017  . Substance induced mood disorder (HCC) [F19.94] 11/28/2016  . Bipolar 1 disorder (HCC) [F31.9] 11/27/2016  . Bipolar affective disorder, depressed, severe (HCC) [F31.4] 04/14/2016  . Suicide attempt by drug ingestion (HCC) [T50.902A]   . Cocaine abuse (HCC) [F14.10] 04/12/2016  . Severe recurrent major depression with psychotic features (HCC) [F33.3] 07/01/2011  . 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 Polysubstance dependence (HCC) [F19.20] 07/01/2011   Total Time spent with patient: 20 minutes  Past Psychiatric History: As in H&P  Past Medical History:  Past Medical History:  Diagnosis Date  . Allergy   . Anxiety   . Depression   . Headache(784.0)   . Vision abnormalities     Past Surgical History:  Procedure Laterality Date  . NO PAST SURGERIES     Family History:  Family History  Problem Relation Age of Onset  . OCD Mother   . Drug abuse Mother   . Schizophrenia Father   . Drug abuse Father    Family Psychiatric  History: As in H&P Social History:  History  Alcohol Use  . 0.5 oz/week  . 1 Standard drinks or equivalent per week    Comment: 1 - 1/2 5ths of bourbon     History  Drug Use  . Frequency: 7.0 times per week  . Types: Amphetamines, Benzodiazepines, Cocaine, Codeine, Fentanyl, Hashish, Heroin, Hydrocodone, Hydromorphone, LSD, Marijuana, MDMA (Ecstacy), Methamphetamines,  Morphine, Nitrous oxide, Opium, PCP, Solvent inhalants    Social History   Social History  . Marital status: Single    Spouse name: N/A  . Number of children: N/A  . Years of education: N/A   Occupational History  . student     11th grade at a [employer] facility   Social History Main Topics  . Smoking status: Current Every Day Smoker    Packs/day: 1.00    Years: 5.00    Types: Cigarettes  .  Smokeless tobacco: Never Used  . Alcohol use 0.5 oz/week    1 Standard drinks or equivalent per week     Comment: 1 - 1/2 5ths of bourbon  . Drug use: Yes    Frequency: 7.0 times per week    Types: Amphetamines, Benzodiazepines, Cocaine, Codeine, Fentanyl, Hashish, Heroin, Hydrocodone, Hydromorphone, LSD, Marijuana, MDMA (Ecstacy), Methamphetamines, Morphine, Nitrous oxide, Opium, PCP, Solvent inhalants  . Sexual activity: Yes    Partners: Female    Birth control/ protection: Condom   Other Topics Concern  . None   Social History Narrative  . None   Additional Social History:    Pain Medications: Pt is positive for opiates.  Patient admits to taking a vicodin yesterday (08/30). Prescriptions: None Over the Counter: Benedryl occasionally. History of alcohol / drug use?: Yes Longest period of sobriety (when/how long): 2 weeks Negative Consequences of Use: Legal Withdrawal Symptoms: Agitation, Aggressive/Assaultive, Fever / Chills, Irritability, Sweats, Tingling Sleep: Better  Appetite:Better  Current Medications: Current Facility-Administered Medications  Medication Dose Route Frequency Provider Last Rate Last Dose  . alum & mag hydroxide-simeth (MAALOX/MYLANTA) 200-200-20 MG/5ML suspension 30 mL  30 mL Oral Q4H PRN Fransisca Kaufmann A, NP      . feeding supplement (ENSURE ENLIVE) (ENSURE ENLIVE) liquid 237 mL  237 mL Oral BID BM Donell Sievert E, PA-C   237 mL at 01/22/17 0906  . magnesium hydroxide (MILK OF MAGNESIA) suspension 30 mL  30 mL Oral Daily PRN Fransisca Kaufmann A, NP      . nicotine polacrilex (NICORETTE) gum 2 mg  2 mg Oral PRN Georgiann Cocker, MD   2 mg at 01/22/17 0906  . OLANZapine (ZYPREXA) tablet 10 mg  10 mg Oral QHS Lindel Marcell, Delight Ovens, MD   10 mg at 01/21/17 2113  . traZODone (DESYREL) tablet 100 mg  100 mg Oral QHS,MR X 1 Kerry Hough, PA-C   100 mg at 01/21/17 2113    Lab Results: No results found for this or any previous visit (from the past 48  hour(s)).  Blood Alcohol level:  Lab Results  Component Value Date   ETH <10 01/15/2017   ETH 45 (H) 11/26/2016    Metabolic Disorder Labs: Lab Results  Component Value Date   HGBA1C 4.9 12/02/2016   MPG 93.93 12/02/2016   Lab Results  Component Value Date   PROLACTIN 32.5 (H) 12/02/2016   Lab Results  Component Value Date   CHOL 146 12/02/2016   TRIG 102 12/02/2016   HDL 48 12/02/2016   CHOLHDL 3.0 12/02/2016   VLDL 20 12/02/2016   LDLCALC 78 12/02/2016    Physical Findings: AIMS: Facial and Oral Movements Muscles of Facial Expression: None, normal Lips and Perioral Area: None, normal Jaw: None, normal Tongue: None, normal,Extremity Movements Upper (arms, wrists, hands, fingers): None, normal Lower (legs, knees, ankles, toes): None, normal, Trunk Movements Neck, shoulders, hips: None, normal, Overall Severity Severity of abnormal movements (highest score from questions above): None, normal  Incapacitation due to abnormal movements: None, normal Patient's awareness of abnormal movements (rate only patient's report): No Awareness, Dental Status Current problems with teeth and/or dentures?: No Does patient usually wear dentures?: No  CIWA:    COWS:  COWS Total Score: 8  Musculoskeletal: Strength & Muscle Tone: within normal limits Gait & Station: normal Patient leans: N/A  Psychiatric Specialty Exam: Physical Exam  Constitutional: He appears well-developed and well-nourished.  HENT:  Head: Normocephalic and atraumatic.  Respiratory: Effort normal.  Psychiatric:  As above    ROS  Blood pressure 119/70, pulse 74, temperature 97.8 F (36.6 C), temperature source Oral, resp. rate 16, height 6' (1.829 m), weight 62.6 kg (138 lb).Body mass index is 18.72 kg/m.  General Appearance:  Calm and cooperative. Better rapport today. No EPS.   Eye Contact:  Good  Speech:  Spontaneous. Normal tone and rate  Volume:  Normal  Mood: Worried about prospects of not seeing  his kids.   Affect:  Full range and appropriate.   Thought Process:  Linear  Orientation:  WNL  Thought Content:  . No delusional theme. No preoccupation with violent thoughts.  No hallucination in any modality.   Suicidal Thoughts:  None  Homicidal Thoughts:  No  Memory: WNL  Judgement:  Fair  Insight:  Partial   Psychomotor Activity:  Normal  Concentration:  Good  Recall: WNL   Fund of Knowledge:  Good  Language:  Good  Akathisia:  Negative  Handed:    AIMS (if indicated):     Assets:  Physical Health  ADL's:  Impaired  Cognition:  Impaired,  Mild  Sleep:  Number of Hours: 5.5     Treatment Plan Summary:  Patient is calmer today. He is future oriented. He does not have any violent thoughts. He is stressed by recent protection order but seems to have been problem solving since he got the information. We plan to maintain his medications at current dose. We would collaborate his plans and evaluate him further    Psychiatric: Bipolar disorder ,,,, current episode is depression SUD SIMD  Medical:  Psychosocial:  Job loss Loss of access to his kids Homelessness  PLAN: 1. Continue current regimen 2. Continue to monitor mood, behavior and interaction with peers    Georgiann CockerVincent A Jeanine Caven, MD 01/22/2017, 11:14 AMPatient ID: Madelyn Brunnerrenton A Mouser, male   DOB: 1996/01/31, 21 y.o.   MRN: [id number] Patient ID: Madelyn Brunnerrenton A Scarbro, male   DOB: 1996/01/31, 21 y.o.   MRN: [id number] Patient ID: Madelyn Brunnerrenton A Birkland, male   DOB: 1996/01/31, 21 y.o.   MRN: [id number]

## 2017-01-23 NOTE — Progress Notes (Signed)
Patient did attend the evening speaker AA meeting.  

## 2017-01-23 NOTE — Progress Notes (Signed)
D   Pt is childlike but is appropriate in his interaction with others   He attends groups and is compliant with treatment  A   Verbal support given    Medications administered and effectiveness monitored   Q 15 min checks R   Pt is safe at present time 

## 2017-01-23 NOTE — Progress Notes (Signed)
Adult Psychoeducational Group Note  Date:  01/23/2017 Time:  1:52 AM  Group Topic/Focus:  Wrap-Up Group:   The focus of this group is to help patients review their daily goal of treatment and discuss progress on daily workbooks.  Participation Level:  Active  Participation Quality:  Appropriate  Affect:  Appropriate  Cognitive:  Appropriate  Insight: Appropriate  Engagement in Group:  Engaged  Modes of Intervention:  Discussion  Additional Comments:  Pt attended and participated in the AA group session tonight.   Felipa FurnaceChristopher  Sharica Roedel 01/23/2017, 1:52 AM

## 2017-01-23 NOTE — Progress Notes (Signed)
D   Pt is childlike but is appropriate in his interaction with others   He attends groups and is compliant with treatment  A   Verbal support given    Medications administered and effectiveness monitored   Q 15 min checks R   Pt is safe at present time

## 2017-01-23 NOTE — [facility] Group Notes (Addendum)
[facility]  Date/Time:    01/23/2017  1:15-2:15PM  Type of Therapy and Topic:  Group Therapy:  Healthy vs Unhealthy Coping Skills  Participation Level:  Active   Description of Group:  The focus of this group was to determine what unhealthy coping techniques typically are used by group members and what healthy coping techniques would be helpful in coping with various problems. Patients were guided in becoming aware of the differences between healthy and unhealthy coping techniques.  Patients were asked to identify 1-2 healthy coping skills they would like to learn to use more effectively, and many mentioned meditation, breathing, and relaxation.  These were explained, samples demonstrated, and resources shared for how to learn more at discharge.   At the end of group, additional ideas of healthy coping skills were shared in a fun exercise.  Therapeutic Goals 1. Patients learned that coping is what human beings do all day long to deal with various situations in their lives 2. Patients defined and discussed healthy vs unhealthy coping techniques 3. Patients identified their preferred coping techniques and identified whether these were healthy or unhealthy 4. Patients determined 1-2 healthy coping skills they would like to become more familiar with and use more often, and practiced a few meditations 5. Patients provided support and ideas to each other  Summary of Patient Progress: During group, patient expressed that he uses street drugs, does not like to tell his "war stories" but if he does, will always laugh about it, because it is that or cry.  He was supportive of others throughout group.   Therapeutic Modalities Cognitive Behavioral Therapy Motivational Interviewing   Ambrose MantleMareida Grossman-Orr, LCSW 01/23/2017  2:15pm

## 2017-01-23 NOTE — Progress Notes (Signed)
[facility] MD Progress Note  01/23/2017 3:50 PM Dustin Owens  MRN:  [id number] Subjective:   21 y.o  Caucasian male, divorced, homeless, unemployed. Background history of Bipolar Disorder and SUD. Recently discharged from our unit. Presented to the ER via EMS. Overdosed on Methylphenidate and Amlodipine. Was admitted medically for a couple of days.   Chart reviewed today. Patient discussed at team today. SW plans to collaborate his reported support from his grand-mother.   Staff reports he has been socializing with peers. Reported to be "childish" but no behavioral issues. He has been maintaining normal biological rhythm.   Seen today. Says he has decided against going to his grand mother's place in [facility]. Now wants to stay at a halfway house here in [place]. Says he has been feeling good. Denies any suicidal thoughts. Does not feel depressed. No evidence of psychosis. Patient encouraged to think through the pros and cons of any aftercare plan he is considering.    Principal Problem: Bipolar affective disorder, depressed, severe (HCC) Diagnosis:   Patient Active Problem List   Diagnosis Date Noted  . Polysubstance abuse (HCC) [F19.10] 01/18/2017  . Overdose, drug, intentional self-harm, initial encounter (HCC) [T50.902A] 01/16/2017  . Substance induced mood disorder (HCC) [F19.94] 11/28/2016  . Bipolar 1 disorder (HCC) [F31.9] 11/27/2016  . Bipolar affective disorder, depressed, severe (HCC) [F31.4] 04/14/2016  . Suicide attempt by drug ingestion (HCC) [T50.902A]   . Cocaine abuse (HCC) [F14.10] 04/12/2016  . Severe recurrent major depression with psychotic features (HCC) [F33.3] 07/01/2011  . 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 Polysubstance dependence (HCC) [F19.20] 07/01/2011   Total Time spent with patient: 20 minutes  Past Psychiatric History: As in H&P  Past Medical History:  Past Medical History:  Diagnosis Date  . Allergy   . Anxiety   . Depression   .  Headache(784.0)   . Vision abnormalities     Past Surgical History:  Procedure Laterality Date  . NO PAST SURGERIES     Family History:  Family History  Problem Relation Age of Onset  . OCD Mother   . Drug abuse Mother   . Schizophrenia Father   . Drug abuse Father    Family Psychiatric  History: As in H&P Social History:  History  Alcohol Use  . 0.5 oz/week  . 1 Standard drinks or equivalent per week    Comment: 1 - 1/2 5ths of bourbon     History  Drug Use  . Frequency: 7.0 times per week  . Types: Amphetamines, Benzodiazepines, Cocaine, Codeine, Fentanyl, Hashish, Heroin, Hydrocodone, Hydromorphone, LSD, Marijuana, MDMA (Ecstacy), Methamphetamines, Morphine, Nitrous oxide, Opium, PCP, Solvent inhalants    Social History   Social History  . Marital status: Single    Spouse name: N/A  . Number of children: N/A  . Years of education: N/A   Occupational History  . student     11th grade at a [employer] facility   Social History Main Topics  . Smoking status: Current Every Day Smoker    Packs/day: 1.00    Years: 5.00    Types: Cigarettes  . Smokeless tobacco: Never Used  . Alcohol use 0.5 oz/week    1 Standard drinks or equivalent per week     Comment: 1 - 1/2 5ths of bourbon  . Drug use: Yes    Frequency: 7.0 times per week    Types: Amphetamines, Benzodiazepines, Cocaine, Codeine, Fentanyl, Hashish, Heroin, Hydrocodone, Hydromorphone, LSD, Marijuana, MDMA (Ecstacy), Methamphetamines, Morphine, Nitrous  oxide, Opium, PCP, Solvent inhalants  . Sexual activity: Yes    Partners: Female    Birth control/ protection: Condom   Other Topics Concern  . None   Social History Narrative  . None   Additional Social History:    Pain Medications: Pt is positive for opiates.  Patient admits to taking a vicodin yesterday (08/30). Prescriptions: None Over the Counter: Benedryl occasionally. History of alcohol / drug use?: Yes Longest period of sobriety (when/how long): 2  weeks Negative Consequences of Use: Legal Withdrawal Symptoms: Agitation, Aggressive/Assaultive, Fever / Chills, Irritability, Sweats, Tingling Sleep: Good  Appetite:Good  Current Medications: Current Facility-Administered Medications  Medication Dose Route Frequency Provider Last Rate Last Dose  . alum & mag hydroxide-simeth (MAALOX/MYLANTA) 200-200-20 MG/5ML suspension 30 mL  30 mL Oral Q4H PRN Fransisca Kaufmann A, NP      . feeding supplement (ENSURE ENLIVE) (ENSURE ENLIVE) liquid 237 mL  237 mL Oral BID BM Simon, Spencer E, PA-C   237 mL at 01/23/17 1145  . magnesium hydroxide (MILK OF MAGNESIA) suspension 30 mL  30 mL Oral Daily PRN Fransisca Kaufmann A, NP      . nicotine polacrilex (NICORETTE) gum 2 mg  2 mg Oral PRN Georgiann Cocker, MD   2 mg at 01/23/17 1145  . OLANZapine (ZYPREXA) tablet 10 mg  10 mg Oral QHS Dayzha Pogosyan, Delight Ovens, MD   10 mg at 01/22/17 2121  . traZODone (DESYREL) tablet 100 mg  100 mg Oral QHS,MR X 1 Kerry Hough, PA-C   100 mg at 01/22/17 2120    Lab Results: No results found for this or any previous visit (from the past 48 hour(s)).  Blood Alcohol level:  Lab Results  Component Value Date   ETH <10 01/15/2017   ETH 45 (H) 11/26/2016    Metabolic Disorder Labs: Lab Results  Component Value Date   HGBA1C 4.9 12/02/2016   MPG 93.93 12/02/2016   Lab Results  Component Value Date   PROLACTIN 32.5 (H) 12/02/2016   Lab Results  Component Value Date   CHOL 146 12/02/2016   TRIG 102 12/02/2016   HDL 48 12/02/2016   CHOLHDL 3.0 12/02/2016   VLDL 20 12/02/2016   LDLCALC 78 12/02/2016    Physical Findings: AIMS: Facial and Oral Movements Muscles of Facial Expression: None, normal Lips and Perioral Area: None, normal Jaw: None, normal Tongue: None, normal,Extremity Movements Upper (arms, wrists, hands, fingers): None, normal Lower (legs, knees, ankles, toes): None, normal, Trunk Movements Neck, shoulders, hips: None, normal, Overall  Severity Severity of abnormal movements (highest score from questions above): None, normal Incapacitation due to abnormal movements: None, normal Patient's awareness of abnormal movements (rate only patient's report): No Awareness, Dental Status Current problems with teeth and/or dentures?: No Does patient usually wear dentures?: No  CIWA:    COWS:  COWS Total Score: 8  Musculoskeletal: Strength & Muscle Tone: within normal limits Gait & Station: normal Patient leans: N/A  Psychiatric Specialty Exam: Physical Exam  Constitutional: He appears well-developed and well-nourished.  HENT:  Head: Normocephalic and atraumatic.  Respiratory: Effort normal.  Psychiatric:  As above    ROS  Blood pressure 110/71, pulse 60, temperature (!) 97.4 F (36.3 C), temperature source Oral, resp. rate 16, height 6' (1.829 m), weight 62.6 kg (138 lb).Body mass index is 18.72 kg/m.  General Appearance:  Neatly dressed, calm and cooperative.   Eye Contact:  Good  Speech:  Spontaneous. Normal tone and rate  Volume:  Normal  Mood: Euthymic  Affect:  Full range and appropriate.   Thought Process:  Linear  Orientation:  WNL  Thought Content:  . No delusional theme. No preoccupation with violent thoughts.  No hallucination in any modality.   Suicidal Thoughts:  None  Homicidal Thoughts:  No  Memory: WNL  Judgement:  Fair  Insight:  Partial   Psychomotor Activity:  Normal  Concentration:  Good  Recall: WNL   Fund of Knowledge:  Good  Language:  Good  Akathisia:  Negative  Handed:    AIMS (if indicated):     Assets:  Physical Health  ADL's:  Impaired  Cognition:  Impaired,  Mild  Sleep:  Number of Hours: 6     Treatment Plan Summary:  Patient's aftercare plan flips on a daily basis. He is not fully committed to addiction treatment. He is not processing ways of dealing with his issues practically. Patient is very impulsive. He remains a risk to self. We would evaluate him further.      Psychiatric: Bipolar disorder ,,,, current episode is depression SUD SIMD  Medical:  Psychosocial:  Job loss Loss of access to his kids Homelessness  PLAN: 1. Continue current regimen 2. Continue to monitor mood, behavior and interaction with peers    Georgiann CockerVincent A Reda Gettis, MD 01/23/2017, 3:50 PMPatient ID: Dustin Owens, male   DOB: [birth year], 21 y.o.   MRN: [id number] Patient ID: Dustin Owens, male   DOB: [birth year], 21 y.o.   MRN: [id number] Patient ID: Dustin Owens, male   DOB: [birth year], 21 y.o.   MRN: [id number] Patient ID: Dustin Owens, male   DOB: [birth year], 21 y.o.   MRN: [id number]

## 2017-01-23 NOTE — Progress Notes (Signed)
Data. Patient denies SI/HI/AVH. Verbally contracts for safety on the unit and to come to staff before acting of any self harm thoughts/feelings.  Patient interacting well with staff and other patients. He is more redirectable today, though he is still acting somewhat silly when the nursing students are in the unit. Patient reports today, "I heard my ex whaen I was on the phone with my mom last evening. She was in the back ground screaming and crying to see me. I don't understand it. She was the one who left me and she was screwing around with nine men while she was married to me. I counted them up. I really just want to whale on someone." On his self assessment patient reports 2/10 for depression, 0/10 for hopelessness and 11/10 for anxiety. His goal is, "Get through the day." Action. Emotional support and encouragement offered. Education provided on medication, indications and side effect. Q 15 minute checks done for safety. Response. Safety on the unit maintained through 15 minute checks.  Medications taken as prescribed. Attended groups. Remained calm and appropriate through out shift.

## 2017-01-23 NOTE — Plan of Care (Signed)
Problem: Activity: Goal: Interest or engagement in activities will improve Outcome: Progressing Patient has been attending most unit activities, groups and been participating in the  Milieu. Goal: Sleeping patterns will improve Outcome: Progressing Patient is sleeping well at night and napping minimally during the day.  Problem: Safety: Goal: Periods of time without injury will increase Outcome: Progressing Patient has had no injuries since admit.

## 2017-01-24 NOTE — Progress Notes (Signed)
Patient did attend the evening speaker AA meeting.  

## 2017-01-24 NOTE — BHH Group Notes (Signed)
[facility] LCSW Group Therapy Note  Date/Time:  01/24/2017   1:15-2:15pm  Type of Therapy and Topic:  Group Therapy:  Healthy and Unhealthy Supports  Participation Level:  Active   Description of Group:  Patients in this group were introduced to the idea of adding a variety of healthy supports to address the various needs in their lives.  Patients identified and described healthy supports versus unhealthy supports in general, then gave examples of each in their own lives.   They discussed what additional healthy supports could be helpful in their recovery and wellness after discharge in order to pursue the behaviors they identified as necessary to a life of wellness.   An emphasis was placed on using counselor, doctor, therapy groups, 12-step groups, and problem-specific support groups to expand supports.    Therapeutic Goals:   1)  discuss importance of adding supports to stay well once out of the hospital  2)  compare healthy versus unhealthy supports and identify some examples of each  3)  generate ideas and descriptions of healthy supports that can be added  4)  offer mutual support about how to address unhealthy supports  5)  encourage active participation in and adherence to discharge plan    Summary of Patient Progress:  The patient shared that the current unhealthy supports available in his life are himself and anyone in his life "who thinks I can stop with just one", while the current healthy supports are a peer he has met in the hospital.  The patient expressed a willingness to add anything that is necessary to help in his recovery journey.   Therapeutic Modalities:   Motivational Interviewing Brief Solution-Focused Therapy  Selmer Dominion, LCSW 01/24/2017, 4:25pm

## 2017-01-24 NOTE — Plan of Care (Signed)
Problem: Activity: Goal: Interest or engagement in activities will improve Outcome: Progressing Pt is engaged in groups and other unit activities Goal: Sleeping patterns will improve Outcome: Progressing Pt is sleeping at least 6 hours most nights

## 2017-01-24 NOTE — Progress Notes (Signed)
[facility] MD Progress Note  01/24/2017 12:37 PM Dustin Owens  MRN:  [id number] Subjective:   21 y.o  Caucasian male, divorced, homeless, unemployed. Background history of Bipolar Disorder and SUD. Recently discharged from our unit. Presented to the ER via EMS. Overdosed on Methylphenidate and Amlodipine. Was admitted medically for a couple of days.   Chart reviewed today. Patient discussed at team today. SW plans to collaborate his reported support from his grand-mother.   Staff reports he has been socializing with peers. No behavioral issues. He has been tolerating his medications well. He has not voiced any futility thoughts.    Seen today. Says he is still focused on getting into the [employer]. Says he is working on how to come up with his deposit. His goal is to get a job once he is in there. Denies nay suicidal thoughts. Denies any thoughts of homicide or violence. Not depressed. No cravings for substances.   Principal Problem: Bipolar affective disorder, depressed, severe (HCC) Diagnosis:   Patient Active Problem List   Diagnosis Date Noted  . Polysubstance abuse (HCC) [F19.10] 01/18/2017  . Overdose, drug, intentional self-harm, initial encounter (HCC) [T50.902A] 01/16/2017  . Substance induced mood disorder (HCC) [F19.94] 11/28/2016  . Bipolar 1 disorder (HCC) [F31.9] 11/27/2016  . Bipolar affective disorder, depressed, severe (HCC) [F31.4] 04/14/2016  . Suicide attempt by drug ingestion (HCC) [T50.902A]   . Cocaine abuse (HCC) [F14.10] 04/12/2016  . Severe recurrent major depression with psychotic features (HCC) [F33.3] 07/01/2011  . 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 Polysubstance dependence (HCC) [F19.20] 07/01/2011   Total Time spent with patient: 20 minutes  Past Psychiatric History: As in H&P  Past Medical History:  Past Medical History:  Diagnosis Date  . Allergy   . Anxiety   . Depression   . Headache(784.0)   . Vision abnormalities      Past Surgical History:  Procedure Laterality Date  . NO PAST SURGERIES     Family History:  Family History  Problem Relation Age of Onset  . OCD Mother   . Drug abuse Mother   . Schizophrenia Father   . Drug abuse Father    Family Psychiatric  History: As in H&P Social History:  History  Alcohol Use  . 0.5 oz/week  . 1 Standard drinks or equivalent per week    Comment: 1 - 1/2 5ths of bourbon     History  Drug Use  . Frequency: 7.0 times per week  . Types: Amphetamines, Benzodiazepines, Cocaine, Codeine, Fentanyl, Hashish, Heroin, Hydrocodone, Hydromorphone, LSD, Marijuana, MDMA (Ecstacy), Methamphetamines, Morphine, Nitrous oxide, Opium, PCP, Solvent inhalants    Social History   Social History  . Marital status: Single    Spouse name: N/A  . Number of children: N/A  . Years of education: N/A   Occupational History  . student     11th grade at a [employer] facility   Social History Main Topics  . Smoking status: Current Every Day Smoker    Packs/day: 1.00    Years: 5.00    Types: Cigarettes  . Smokeless tobacco: Never Used  . Alcohol use 0.5 oz/week    1 Standard drinks or equivalent per week     Comment: 1 - 1/2 5ths of bourbon  . Drug use: Yes    Frequency: 7.0 times per week    Types: Amphetamines, Benzodiazepines, Cocaine, Codeine, Fentanyl, Hashish, Heroin, Hydrocodone, Hydromorphone, LSD, Marijuana, MDMA (Ecstacy), Methamphetamines, Morphine, Nitrous oxide, Opium, PCP,  Solvent inhalants  . Sexual activity: Yes    Partners: Female    Birth control/ protection: Condom   Other Topics Concern  . None   Social History Narrative  . None   Additional Social History:    Pain Medications: Pt is positive for opiates.  Patient admits to taking a vicodin yesterday (08/30). Prescriptions: None Over the Counter: Benedryl occasionally. History of alcohol / drug use?: Yes Longest period of sobriety (when/how long): 2 weeks Negative Consequences of Use:  Legal Withdrawal Symptoms: Agitation, Aggressive/Assaultive, Fever / Chills, Irritability, Sweats, Tingling Sleep: Good  Appetite:Good  Current Medications: Current Facility-Administered Medications  Medication Dose Route Frequency Provider Last Rate Last Dose  . alum & mag hydroxide-simeth (MAALOX/MYLANTA) 200-200-20 MG/5ML suspension 30 mL  30 mL Oral Q4H PRN Fransisca Kaufmannavis, Laura A, NP      . feeding supplement (ENSURE ENLIVE) (ENSURE ENLIVE) liquid 237 mL  237 mL Oral BID BM Simon, Spencer E, PA-C   237 mL at 01/24/17 0945  . magnesium hydroxide (MILK OF MAGNESIA) suspension 30 mL  30 mL Oral Daily PRN Fransisca Kaufmannavis, Laura A, NP      . nicotine polacrilex (NICORETTE) gum 2 mg  2 mg Oral PRN Georgiann CockerIzediuno, Vincent A, MD   2 mg at 01/24/17 [id number]  . OLANZapine (ZYPREXA) tablet 10 mg  10 mg Oral QHS Izediuno, Delight OvensVincent A, MD   10 mg at 01/23/17 2120  . traZODone (DESYREL) tablet 100 mg  100 mg Oral QHS,MR X 1 Kerry HoughSimon, Spencer E, PA-C   100 mg at 01/23/17 2121    Lab Results: No results found for this or any previous visit (from the past 48 hour(s)).  Blood Alcohol level:  Lab Results  Component Value Date   ETH <10 01/15/2017   ETH 45 (H) 11/26/2016    Metabolic Disorder Labs: Lab Results  Component Value Date   HGBA1C 4.9 12/02/2016   MPG 93.93 12/02/2016   Lab Results  Component Value Date   PROLACTIN 32.5 (H) 12/02/2016   Lab Results  Component Value Date   CHOL 146 12/02/2016   TRIG 102 12/02/2016   HDL 48 12/02/2016   CHOLHDL 3.0 12/02/2016   VLDL 20 12/02/2016   LDLCALC 78 12/02/2016    Physical Findings: AIMS: Facial and Oral Movements Muscles of Facial Expression: None, normal Lips and Perioral Area: None, normal Jaw: None, normal Tongue: None, normal,Extremity Movements Upper (arms, wrists, hands, fingers): None, normal Lower (legs, knees, ankles, toes): None, normal, Trunk Movements Neck, shoulders, hips: None, normal, Overall Severity Severity of abnormal movements (highest  score from questions above): None, normal Incapacitation due to abnormal movements: None, normal Patient's awareness of abnormal movements (rate only patient's report): No Awareness, Dental Status Current problems with teeth and/or dentures?: No Does patient usually wear dentures?: No  CIWA:    COWS:  COWS Total Score: 8  Musculoskeletal: Strength & Muscle Tone: within normal limits Gait & Station: normal Patient leans: N/A  Psychiatric Specialty Exam: Physical Exam  Constitutional: He appears well-developed and well-nourished.  HENT:  Head: Normocephalic and atraumatic.  Respiratory: Effort normal.  Psychiatric:  As above    ROS  Blood pressure 110/71, pulse 60, temperature (!) 97.4 F (36.3 C), temperature source Oral, resp. rate 16, height 6' (1.829 m), weight 62.6 kg (138 lb).Body mass index is 18.72 kg/m.  General Appearance:  Pleasant, cooperative. Not internally distracted. No EPS  Eye Contact:  Good  Speech:  Spontaneous. Normal tone and rate  Volume:  Normal  Mood: Euthymic  Affect:  Full range and appropriate.   Thought Process:  Linear  Orientation:  WNL  Thought Content:  . No delusional theme. No preoccupation with violent thoughts.  No hallucination in any modality.   Suicidal Thoughts:  None  Homicidal Thoughts:  No  Memory: WNL  Judgement:  Fair  Insight:  Partial   Psychomotor Activity:  Normal  Concentration:  Good  Recall: WNL   Fund of Knowledge:  Good  Language:  Good  Akathisia:  Negative  Handed:    AIMS (if indicated):     Assets:  Physical Health  ADL's:  Impaired  Cognition:  Impaired,  Mild  Sleep:  Number of Hours: 5.5     Treatment Plan Summary:  Patient remains impulsive. He remains a risk to self. We would evaluate him further.     Psychiatric: Bipolar disorder ,,,, current episode is depression SUD SIMD  Medical:  Psychosocial:  Job loss Loss of access to his kids Homelessness  PLAN: 1. Continue current  regimen 2. Continue to monitor mood, behavior and interaction with peers    Georgiann Cocker, MD 01/24/2017, 12:37 PMPatient ID: Dustin Owens, male   DOB: 12-25-1995, 21 y.o.   MRN: [id number] Patient ID: Dustin Owens, male   DOB: Jul 26, 1995, 21 y.o.   MRN: [id number] Patient ID: Dustin Owens, male   DOB: 16-Oct-1995, 21 y.o.   MRN: [id number] Patient ID: Dustin Owens, male   DOB: [birth year], 21 y.o.   MRN: [id number] Patient ID: Dustin Owens, male   DOB: 01-23-1996, 21 y.o.   MRN: [id number]

## 2017-01-24 NOTE — Progress Notes (Signed)
Data. Patient denies SI/HI/AVH. Verbally contracts for safety on the unit and to come to staff before acting of any self harm thoughts/feelings.   Patient interacting well with staff and other patients. Affect is more worried and he is more distractible this shift. Patient is noted having difficulty staying still and his conversation is somewhat tangential today.Patient did sleep in this shift, until about mid morning. Action. Emotional support and encouragement offered. Education provided on medication, indications and side effect. Q 15 minute checks done for safety. Response. Safety on the unit maintained through 15 minute checks.  Medications taken as prescribed. Attended groups. Remained calm and appropriate through out shift.

## 2017-01-25 DIAGNOSIS — F1920 Other psychoactive substance dependence, uncomplicated: Secondary | ICD-10-CM

## 2017-01-25 NOTE — Progress Notes (Signed)
Recreation Therapy Notes  Date: 01/25/17 Time: 0930 Location: [address] Dayroom  Group Topic: Stress Management  Goal Area(s) Addresses:  Patient will verbalize importance of using healthy stress management.  Patient will identify positive emotions associated with healthy stress management.   Intervention: Stress Management  Activity :  Meditation.  LRT introduced the stress management technique of meditation.  LRT read script that allowed patients to sit quietly, focus on their breathing and just being.  Education:  Stress Management, Discharge Planning.   Education Outcome: Acknowledges edcuation/In group clarification offered/Needs additional education  Clinical Observations/Feedback: Pt did not attend group.    Dustin RancherMarjette Pinkie Owens, LRT/CTRS         Dustin AbedLindsay, Esly Selvage A 01/25/2017 11:14 AM

## 2017-01-25 NOTE — Plan of Care (Signed)
Problem: Education: Goal: Knowledge of [facility] Education information/materials will improve Outcome: Completed/Met Date Met: 01/25/17 Patient verbalizes understanding of [facility] procedures, policy and treatment plan.  Problem: Medication: Goal: Compliance with prescribed medication regimen will improve Outcome: Completed/Met Date Met: 01/25/17 Patient has been med compliant and states he intends to remain so upon discharge.

## 2017-01-25 NOTE — [facility] Group Notes (Signed)
[facility] Group Therapy 01/25/2017 1:15pm  Type of Therapy: Mental Health Association Presentation  Participation Level: Active  Participation Quality: Attentive  Affect: Appropriate  Cognitive: Oriented  Insight: Developing/Improving  Engagement in Therapy: Engaged  Modes of Intervention: Discussion, Education and Socialization  Summary of Progress/Problems: Mental Health Association (MHA) Speaker came to talk about his personal journey with living with a mental health diagnosis. The pt processed ways by which to relate to the speaker. MHA speaker provided handouts and educational information pertaining to groups and services offered by the MHA. Pt was engaged in speaker's presentation and was receptive to resources provided.    Dustin Owens Dustin Owens, MSW, LCSWA 01/25/2017 2:53 PM   

## 2017-01-25 NOTE — Tx Team (Signed)
Interdisciplinary Treatment and Diagnostic Plan Update  01/25/2017 Time of Session: 1610RU Dustin Owens MRN: [id number]  Principal Diagnosis: Bipolar affective disorder, depressed, severe (HCC)  Secondary Diagnoses: Principal Problem:   Bipolar affective disorder, depressed, severe (HCC) Active Problems:   Polysubstance abuse (HCC)   Current Medications:  Current Facility-Administered Medications  Medication Dose Route Frequency Provider Last Rate Last Dose  . alum & mag hydroxide-simeth (MAALOX/MYLANTA) 200-200-20 MG/5ML suspension 30 mL  30 mL Oral Q4H PRN Fransisca Kaufmann A, NP      . feeding supplement (ENSURE ENLIVE) (ENSURE ENLIVE) liquid 237 mL  237 mL Oral BID BM Simon, Spencer E, PA-C   237 mL at 01/25/17 1550  . magnesium hydroxide (MILK OF MAGNESIA) suspension 30 mL  30 mL Oral Daily PRN Fransisca Kaufmann A, NP      . nicotine polacrilex (NICORETTE) gum 2 mg  2 mg Oral PRN Georgiann Cocker, MD   2 mg at 01/25/17 1311  . OLANZapine (ZYPREXA) tablet 10 mg  10 mg Oral QHS Izediuno, Delight Ovens, MD   10 mg at 01/24/17 2139  . traZODone (DESYREL) tablet 100 mg  100 mg Oral QHS,MR X 1 Kerry Hough, PA-C   100 mg at 01/24/17 2335   PTA Medications: Prescriptions Prior to Admission  Medication Sig Dispense Refill Last Dose  . folic acid (FOLVITE) 1 MG tablet Take 1 tablet (1 mg total) by mouth daily. 30 tablet 0   . Multiple Vitamin (MULTIVITAMIN WITH MINERALS) TABS tablet Take 1 tablet by mouth daily. 30 tablet 0   . nicotine (NICODERM CQ - DOSED IN MG/24 HOURS) 14 mg/24hr patch Place 1 patch (14 mg total) onto the skin daily. 28 patch 0   . thiamine 100 MG tablet Take 1 tablet (100 mg total) by mouth daily. 30 tablet 0     Patient Stressors: Loss of relationship with ex wife Marital or family conflict  Patient Strengths: Ability for insight Active sense of humor Physical Health  Treatment Modalities: Medication Management, Group therapy, Case management,  1 to 1 session  with clinician, Psychoeducation, Recreational therapy.   Physician Treatment Plan for Primary Diagnosis: Bipolar affective disorder, depressed, severe (HCC) Long Term Goal(s): Improvement in symptoms so as ready for discharge Improvement in symptoms so as ready for discharge   Short Term Goals: Ability to identify changes in lifestyle to reduce recurrence of condition will improve Ability to verbalize feelings will improve Ability to disclose and discuss suicidal ideas Ability to demonstrate self-control will improve Ability to identify and develop effective coping behaviors will improve Ability to maintain clinical measurements within normal limits will improve Compliance with prescribed medications will improve Ability to identify triggers associated with substance abuse/mental health issues will improve Ability to identify changes in lifestyle to reduce recurrence of condition will improve Ability to verbalize feelings will improve Ability to disclose and discuss suicidal ideas Ability to demonstrate self-control will improve Ability to identify and develop effective coping behaviors will improve Ability to maintain clinical measurements within normal limits will improve Compliance with prescribed medications will improve Ability to identify triggers associated with substance abuse/mental health issues will improve  Medication Management: Evaluate patient's response, side effects, and tolerance of medication regimen.  Therapeutic Interventions: 1 to 1 sessions, Unit Group sessions and Medication administration.  Evaluation of Outcomes: Progressing  Physician Treatment Plan for Secondary Diagnosis: Principal Problem:   Bipolar affective disorder, depressed, severe (HCC) Active Problems:   Polysubstance abuse (HCC)  Long Term Goal(s): Improvement  in symptoms so as ready for discharge Improvement in symptoms so as ready for discharge   Short Term Goals: Ability to identify  changes in lifestyle to reduce recurrence of condition will improve Ability to verbalize feelings will improve Ability to disclose and discuss suicidal ideas Ability to demonstrate self-control will improve Ability to identify and develop effective coping behaviors will improve Ability to maintain clinical measurements within normal limits will improve Compliance with prescribed medications will improve Ability to identify triggers associated with substance abuse/mental health issues will improve Ability to identify changes in lifestyle to reduce recurrence of condition will improve Ability to verbalize feelings will improve Ability to disclose and discuss suicidal ideas Ability to demonstrate self-control will improve Ability to identify and develop effective coping behaviors will improve Ability to maintain clinical measurements within normal limits will improve Compliance with prescribed medications will improve Ability to identify triggers associated with substance abuse/mental health issues will improve     Medication Management: Evaluate patient's response, side effects, and tolerance of medication regimen.  Therapeutic Interventions: 1 to 1 sessions, Unit Group sessions and Medication administration.  Evaluation of Outcomes: Progressing   RN Treatment Plan for Primary Diagnosis: Bipolar affective disorder, depressed, severe (HCC) Long Term Goal(s): Knowledge of disease and therapeutic regimen to maintain health will improve  Short Term Goals: Ability to verbalize frustration and anger appropriately will improve, Ability to demonstrate self-control, Ability to disclose and discuss suicidal ideas and Ability to identify and develop effective coping behaviors will improve  Medication Management: RN will administer medications as ordered by provider, will assess and evaluate patient's response and provide education to patient for prescribed medication. RN will report any adverse  and/or side effects to prescribing provider.  Therapeutic Interventions: 1 on 1 counseling sessions, Psychoeducation, Medication administration, Evaluate responses to treatment, Monitor vital signs and CBGs as ordered, Perform/monitor CIWA, COWS, AIMS and Fall Risk screenings as ordered, Perform wound care treatments as ordered.  Evaluation of Outcomes: Progressing   LCSW Treatment Plan for Primary Diagnosis: Bipolar affective disorder, depressed, severe (HCC) Long Term Goal(s): Safe transition to appropriate next level of care at discharge, Engage patient in therapeutic group addressing interpersonal concerns.  Short Term Goals: Engage patient in aftercare planning with referrals and resources, Facilitate patient progression through stages of change regarding substance use diagnoses and concerns and Identify triggers associated with mental health/substance abuse issues  Therapeutic Interventions: Assess for all discharge needs, 1 to 1 time with Social worker, Explore available resources and support systems, Assess for adequacy in community support network, Educate family and significant other(s) on suicide prevention, Complete Psychosocial Assessment, Interpersonal group therapy.  Evaluation of Outcomes: Progressing   Progress in Treatment: Attending groups: Yes. Participating in groups: Yes. Taking medication as prescribed: Yes. Toleration medication: Yes. Family/Significant other contact made: SPE completed with pt; pt declined to consent to family contact.  Patient understands diagnosis: Yes. Discussing patient identified problems/goals with staff: Yes. Medical problems stabilized or resolved: Yes. Denies suicidal/homicidal ideation: Yes. Issues/concerns per patient self-inventory: No. Other: n/a   New problem(s) identified: Yes, Describe:  pt reports that his stress and anxiety are so high that he cannot process his own thoughts; cope with the loss of his relationship, or deal with  other life stressors. his main focus is to get stabilized on appropriate anti-anxiety medication.   New Short Term/Long Term Goal(s): detox, medication management; elimination of SI thoughts/plans; development of comprehensive mental wellness/sobriety plan.   Discharge Plan or Barriers: Pt is planning to discharge  to a halfway house and follow up outpatient with [facility].  Reason for Continuation of Hospitalization: Anxiety Depression Medication stabilization Withdrawal symptoms  Estimated Length of Stay: 1 day; Pt will likely discharge tomorrow 01/26/17  Attendees: Patient: 01/25/2017 4:03 PM  Physician: Dr. Jackquline Berlin MD 01/25/2017 4:03 PM  Nursing: Everardo Beals RN 01/25/2017 4:03 PM  RN Care Manager: Onnie Boer CM 01/25/2017 4:03 PM  Social Worker: Donnelly Stager, LCSWA 01/25/2017 4:03 PM  Recreational Therapist: x  01/25/2017 4:03 PM  Other: Armandina Stammer NP; Feliz Beam Money NP 01/25/2017 4:03 PM  Other:  01/25/2017 4:03 PM  Other: 01/25/2017 4:03 PM    Scribe for Treatment Team: Jonathon Jordan, LCSWA 01/25/2017 4:03 PM

## 2017-01-25 NOTE — Progress Notes (Signed)
[facility] MD Progress Note  01/25/2017 2:40 PM Dustin Owens  MRN:  [id number] Subjective:   21 y.o  Caucasian male, divorced, homeless, unemployed. Background history of Bipolar Disorder and SUD. Recently discharged from our unit. Presented to the ER via EMS. Overdosed on Methylphenidate and Amlodipine. Was admitted medically for a couple of days.   Chart reviewed today. Patient discussed at team today. SW plans to collaborate his reported support from his grand-mother.   Staff reports he has been socializing with peers. No behavioral issues. He has been tolerating his medications well. He has not voiced any futility thoughts.    Seen today. Tells me that his ex-wife has decided he could see his children. Says he is excited about it. Legal implications as there is a protection order in place was explored with him. Patient has limited concept as he feels she is the one breaking the order. Encouraged to exercise restraints and seek expert advise first. Patient says he is still invested in [facility] [employer]. Says he is just ready to go. Concerns about recent suicidal behaviors were explored with him. Patient is very dismissive and simply states that he knows things would be different this time. He has been tolerating his medications well. No abnormal perception. No suicidal or homicidal thoughts.   Principal Problem: Bipolar affective disorder, depressed, severe (HCC) Diagnosis:   Patient Active Problem List   Diagnosis Date Noted  . Polysubstance abuse (HCC) [F19.10] 01/18/2017  . Overdose, drug, intentional self-harm, initial encounter (HCC) [T50.902A] 01/16/2017  . Substance induced mood disorder (HCC) [F19.94] 11/28/2016  . Bipolar 1 disorder (HCC) [F31.9] 11/27/2016  . Bipolar affective disorder, depressed, severe (HCC) [F31.4] 04/14/2016  . Suicide attempt by drug ingestion (HCC) [T50.902A]   . Cocaine abuse (HCC) [F14.10] 04/12/2016  . Severe recurrent major depression with psychotic features  (HCC) [F33.3] 07/01/2011  . 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 Polysubstance dependence (HCC) [F19.20] 07/01/2011   Total Time spent with patient: 20 minutes  Past Psychiatric History: As in H&P  Past Medical History:  Past Medical History:  Diagnosis Date  . Allergy   . Anxiety   . Depression   . Headache(784.0)   . Vision abnormalities     Past Surgical History:  Procedure Laterality Date  . NO PAST SURGERIES     Family History:  Family History  Problem Relation Age of Onset  . OCD Mother   . Drug abuse Mother   . Schizophrenia Father   . Drug abuse Father    Family Psychiatric  History: As in H&P Social History:  History  Alcohol Use  . 0.5 oz/week  . 1 Standard drinks or equivalent per week    Comment: 1 - 1/2 5ths of bourbon     History  Drug Use  . Frequency: 7.0 times per week  . Types: Amphetamines, Benzodiazepines, Cocaine, Codeine, Fentanyl, Hashish, Heroin, Hydrocodone, Hydromorphone, LSD, Marijuana, MDMA (Ecstacy), Methamphetamines, Morphine, Nitrous oxide, Opium, PCP, Solvent inhalants    Social History   Social History  . Marital status: Single    Spouse name: N/A  . Number of children: N/A  . Years of education: N/A   Occupational History  . student     11th grade at a [employer] facility   Social History Main Topics  . Smoking status: Current Every Day Smoker    Packs/day: 1.00    Years: 5.00    Types: Cigarettes  . Smokeless tobacco: Never Used  . Alcohol use  0.5 oz/week    1 Standard drinks or equivalent per week     Comment: 1 - 1/2 5ths of bourbon  . Drug use: Yes    Frequency: 7.0 times per week    Types: Amphetamines, Benzodiazepines, Cocaine, Codeine, Fentanyl, Hashish, Heroin, Hydrocodone, Hydromorphone, LSD, Marijuana, MDMA (Ecstacy), Methamphetamines, Morphine, Nitrous oxide, Opium, PCP, Solvent inhalants  . Sexual activity: Yes    Partners: Female    Birth control/ protection: Condom   Other Topics  Concern  . None   Social History Narrative  . None   Additional Social History:    Pain Medications: Pt is positive for opiates.  Patient admits to taking a vicodin yesterday (08/30). Prescriptions: None Over the Counter: Benedryl occasionally. History of alcohol / drug use?: Yes Longest period of sobriety (when/how long): 2 weeks Negative Consequences of Use: Legal Withdrawal Symptoms: Agitation, Aggressive/Assaultive, Fever / Chills, Irritability, Sweats, Tingling Sleep: Good  Appetite:Good  Current Medications: Current Facility-Administered Medications  Medication Dose Route Frequency Provider Last Rate Last Dose  . alum & mag hydroxide-simeth (MAALOX/MYLANTA) 200-200-20 MG/5ML suspension 30 mL  30 mL Oral Q4H PRN Fransisca Kaufmannavis, Laura A, NP      . feeding supplement (ENSURE ENLIVE) (ENSURE ENLIVE) liquid 237 mL  237 mL Oral BID BM Simon, Spencer E, PA-C   237 mL at 01/24/17 1435  . magnesium hydroxide (MILK OF MAGNESIA) suspension 30 mL  30 mL Oral Daily PRN Fransisca Kaufmannavis, Laura A, NP      . nicotine polacrilex (NICORETTE) gum 2 mg  2 mg Oral PRN Georgiann CockerIzediuno, Owais Pruett A, MD   2 mg at 01/25/17 1311  . OLANZapine (ZYPREXA) tablet 10 mg  10 mg Oral QHS Treylen Gibbs, Delight OvensVincent A, MD   10 mg at 01/24/17 2139  . traZODone (DESYREL) tablet 100 mg  100 mg Oral QHS,MR X 1 Kerry HoughSimon, Spencer E, PA-C   100 mg at 01/24/17 2335    Lab Results: No results found for this or any previous visit (from the past 48 hour(s)).  Blood Alcohol level:  Lab Results  Component Value Date   ETH <10 01/15/2017   ETH 45 (H) 11/26/2016    Metabolic Disorder Labs: Lab Results  Component Value Date   HGBA1C 4.9 12/02/2016   MPG 93.93 12/02/2016   Lab Results  Component Value Date   PROLACTIN 32.5 (H) 12/02/2016   Lab Results  Component Value Date   CHOL 146 12/02/2016   TRIG 102 12/02/2016   HDL 48 12/02/2016   CHOLHDL 3.0 12/02/2016   VLDL 20 12/02/2016   LDLCALC 78 12/02/2016    Physical Findings: AIMS: Facial and  Oral Movements Muscles of Facial Expression: None, normal Lips and Perioral Area: None, normal Jaw: None, normal Tongue: None, normal,Extremity Movements Upper (arms, wrists, hands, fingers): None, normal Lower (legs, knees, ankles, toes): None, normal, Trunk Movements Neck, shoulders, hips: None, normal, Overall Severity Severity of abnormal movements (highest score from questions above): None, normal Incapacitation due to abnormal movements: None, normal Patient's awareness of abnormal movements (rate only patient's report): No Awareness, Dental Status Current problems with teeth and/or dentures?: No Does patient usually wear dentures?: No  CIWA:    COWS:  COWS Total Score: 8  Musculoskeletal: Strength & Muscle Tone: within normal limits Gait & Station: normal Patient leans: N/A  Psychiatric Specialty Exam: Physical Exam  Constitutional: He appears well-developed and well-nourished.  HENT:  Head: Normocephalic and atraumatic.  Respiratory: Effort normal.  Psychiatric:  As above    ROS  Blood  pressure 136/74, pulse 67, temperature (!) 97.5 F (36.4 C), temperature source Oral, resp. rate 16, height 6' (1.829 m), weight 62.6 kg (138 lb).Body mass index is 18.72 kg/m.  General Appearance: Calm and cooperative. Polite and able to tolerate the frustration of not being discharged today. Not internally distracted.   Eye Contact:  Good  Speech:  Spontaneous. Normal tone and rate  Volume:  Normal  Mood: Euthymic  Affect:  Full range and appropriate.   Thought Process:  Linear  Orientation:  WNL  Thought Content:  . No delusional theme. No preoccupation with violent thoughts.  No hallucination in any modality.   Suicidal Thoughts:  None  Homicidal Thoughts:  No  Memory: WNL  Judgement:  Fair  Insight:  Partial   Psychomotor Activity:  Normal  Concentration:  Good  Recall: WNL   Fund of Knowledge:  Good  Language:  Good  Akathisia:  Negative  Handed:    AIMS (if  indicated):     Assets:  Physical Health  ADL's:  Impaired  Cognition:  Impaired,  Mild  Sleep:  Number of Hours: 5.25     Treatment Plan Summary:  Patient remains impulsive. He remains a risk to self. Safe aftercare remains a challenge. Would continue current mood stabilizer and evaluate him further.   Psychiatric: Bipolar disorder  SUD SIMD  Medical:  Psychosocial:  Job loss Loss of access to his kids Homelessness  PLAN: 1. Continue current regimen 2. Continue to monitor mood, behavior and interaction with peers    Georgiann Cocker, MD 01/25/2017, 2:40 PMPatient ID: Madelyn Brunner, male   DOB: 05-Jan-1996, 21 y.o.   MRN: [id number] Patient ID: KAY RICCIUTI, male   DOB: February 19, 1996, 21 y.o.   MRN: [id number] Patient ID: KIYAAN HAQ, male   DOB: 1996/02/21, 21 y.o.   MRN: [id number] Patient ID: ROLAN WRIGHTSMAN, male   DOB: 1996-01-12, 21 y.o.   MRN: [id number] Patient ID: TOBEN ACUNA, male   DOB: October 05, 1995, 21 y.o.   MRN: [id number] Patient ID: MAKANA ROSTAD, male   DOB: 08/09/1995, 21 y.o.   MRN: [id number]

## 2017-01-25 NOTE — Progress Notes (Signed)
D: Patient observed resting in bed. 1:1 contact made and patient guarded. Minimal information provided. Patient verbalizes to this Clinical research associatewriter "I am fine. I don't have any issues right now." Patient's affect flat, mood depressed. Per self inventory and discussions with writer, rates depression at a 0/10, hopelessness at a 0/10 and anxiety at a 3/10. Rates sleep as poor, appetite as good, energy as normal and concentration as good.  States goal for today is to "discharge, be patient." Denies pain, physical problems.   A: No medications ordered at this time. No prns requested or required. Level III obs in place for safety. Emotional support offered. Encouraged completion of Suicide Safety Plan. Also informed of expectation that he participate in programming. Discussed POC with MD, SW.    R: Patient verbalizes understanding of POC. Patient denies SI/HI/AVH and remains safe on level III obs. Will continue to monitor closely and make verbal contact frequently.

## 2017-01-26 DIAGNOSIS — T481X2A Poisoning by skeletal muscle relaxants [neuromuscular blocking agents], intentional self-harm, initial encounter: Secondary | ICD-10-CM

## 2017-01-26 DIAGNOSIS — Z630 Problems in relationship with spouse or partner: Secondary | ICD-10-CM

## 2017-01-26 DIAGNOSIS — F419 Anxiety disorder, unspecified: Secondary | ICD-10-CM

## 2017-01-26 DIAGNOSIS — F909 Attention-deficit hyperactivity disorder, unspecified type: Secondary | ICD-10-CM

## 2017-01-26 MED ORDER — NICOTINE POLACRILEX 2 MG MT GUM: 2.0000 mg | CHEWING_GUM | OROMUCOSAL | 0 refills | Status: AC | PRN

## 2017-01-26 MED ORDER — OLANZAPINE 10 MG PO TABS: 10.0000 mg | ORAL_TABLET | Freq: Every day | ORAL | 0 refills | Status: AC

## 2017-01-26 MED ORDER — TRAZODONE HCL 100 MG PO TABS: 100.0000 mg | ORAL_TABLET | Freq: Every evening | ORAL | 0 refills | Status: AC | PRN

## 2017-01-26 NOTE — [facility] Group Notes (Signed)
Adult Psychoeducational Group Note  Date:  01/26/2017 Time:  12:12 AM  Group Topic/Focus:  Wrap-Up Group:   The focus of this group is to help patients review their daily goal of treatment and discuss progress on daily workbooks.  Participation Level:  Active  Participation Quality:  Attentive and Sharing  Affect:  Blunted  Cognitive:  Alert and Appropriate  Insight: Appropriate  Engagement in Group:  Distracting  Modes of Intervention:  Discussion  Additional Comments:  Pt attended and participated in group. Pt rated their day a 2/10 due to the fact that they did not get to leave for the day and they were not able to see their kids. Pt goal for the day was to be discharged. A positive was that the pt received a new guitar today.   Lajoyce CornersOctavia A Liliane Mallis 01/26/2017, 12:12 AM

## 2017-01-26 NOTE — Progress Notes (Signed)
D: Patient observed resting in bed this AM and states sleep was poor. Patient verbalizes to this Clinical research associatewriter he is hopeful for discharge today. [facility] however states he needs to go to ex-wife's place to gather belongings. States there is not a 50B as she did not finalize the paper work. Patient's affect animated, mood pleasant. Per self inventory and discussions with writer, rates depression, hopelessness and anxiety all at a 0/10. Rates sleep as poor, appetite as good, energy as normal and concentration as good.  States goal for today is to "leave." Denies pain, physical problems.   A: No AM meds ordered and patient refused Ensure. No prns requested or required. Level III obs in place for safety. Emotional support offered and self inventory reviewed. Encouraged completion of Suicide Safety Plan and programming participation. Discussed POC with MD, SW.    R: Patient verbalizes understanding of POC. Patient denies SI/HI/AVH and remains safe on level III obs. Will continue to monitor closely and make verbal contact frequently. Awaiting discharge.

## 2017-01-26 NOTE — [facility] Group Notes (Signed)
Pt attended spiritual care group on grief and loss facilitated by chaplain Carina Chaplin   Group opened with brief discussion and psycho-social ed around grief and loss in relationships and in relation to self - identifying life patterns, circumstances, changes that cause losses. Established group norm of speaking from own life experience. Group goal of establishing open and affirming space for members to share loss and experience with grief, normalize grief experience and provide psycho social education and grief support.  Group engaged in facilitated discussion around experience of grief, discussed tasks of mourning with acknowledgement of where these fit into their own journey.   Group facilitation drew on Pastoral care, Adlerian, Narrative, and psycho-dynamic group theories.   

## 2017-01-26 NOTE — Progress Notes (Signed)
Patient verbalizes readiness for discharge. Reports ex-wife will pick him up, allow him a short visit with his children, will take him to retrieve his belongings, and then will transport [facility] to [facility] where he will reside. Again states there is no 50B in place. Follow up plan explained, AVS, transition record and SRA given along with prescriptions. Sample meds provided. All belongings returned. Patient verbalizes understanding. Denies SI/HI and assures this Clinical research associatewriter he will seek assistance should that change. Patient discharged ambulatory and in stable condition to ex-wife.

## 2017-01-26 NOTE — Discharge Summary (Signed)
Physician Discharge Summary Note  Patient:  Dustin Owens is an 21 y.o., male MRN:  [id number] DOB:  07/04/1995 Patient phone:  [phone] (home)  Patient address:   [address] [place] [address],  Total Time spent with patient: 30 minutes  Date of Admission:  01/18/2017 Date of Discharge: 01/26/2017  Reason for Admission: Per assessment note-21y.o Caucasian male, divorced, homeless, unemployed. Background history of Bipolar Disorder and SUD. Recently discharged from our unit. Presented to the ER via EMS. Overdosed on Methylphenidate and Amlodipine. Was admitted medically for a couple of days.  Chart reviewed today. Patient discussed at team today. SW plans to collaborate his reported support from his grand-mother. Staff reports he has been socializing with peers. No behavioral issues. He has been tolerating his medications well. He has not voiced any futility thoughts. Seen today. Tells me that his ex-wife has decided he could see his children. Says he is excited about it. Legal implications as there is a protection order in place was explored with him. Patient has limited concept as he feels she is the one breaking the order. Encouraged to exercise restraints and seek expert advise first. Patient says he is still invested in [facility]. Says he is just ready to go. Concerns about recent suicidal behaviors were explored with him. Patient is very dismissive and simply states that he knows things would be different this time. He has been tolerating his medications well. No abnormal perception. No suicidal or homicidal thoughts.   Principal Problem: Bipolar affective disorder, depressed, severe [facility](HCC) Discharge Diagnoses: Patient Active Problem List   Diagnosis Date Noted  . Polysubstance abuse (HCC) [F19.10] 01/18/2017  . Overdose, drug, intentional self-harm, initial encounter (HCC) [T50.902A] 01/16/2017  . Substance induced mood disorder (HCC) [F19.94] 11/28/2016  . Bipolar 1  disorder (HCC) [F31.9] 11/27/2016  . Bipolar affective disorder, depressed, severe (HCC) [F31.4] 04/14/2016  . Suicide attempt by drug ingestion (HCC) [T50.902A]   . Cocaine abuse (HCC) [F14.10] 04/12/2016  . Severe recurrent major depression with psychotic features (HCC) [F33.3] 07/01/2011  . 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 Polysubstance dependence [facility](HCC) [F19.20] 07/01/2011    Past Psychiatric History:   Past Medical History:  Past Medical History:  Diagnosis Date  . Allergy   . Anxiety   . Depression   . Headache(784.0)   . Vision abnormalities     Past Surgical History:  Procedure Laterality Date  . NO PAST SURGERIES     Family History:  Family History  Problem Relation Age of Onset  . OCD Mother   . Drug abuse Mother   . Schizophrenia Father   . Drug abuse Father    Family Psychiatric  History:  Social History:  History  Alcohol Use  . 0.5 oz/week  . 1 Standard drinks or equivalent per week    Comment: 1 - 1/2 5ths of bourbon     History  Drug Use  . Frequency: 7.0 times per week  . Types: Amphetamines, Benzodiazepines, Cocaine, Codeine, Fentanyl, Hashish, Heroin, Hydrocodone, Hydromorphone, LSD, Marijuana, MDMA (Ecstacy), Methamphetamines, Morphine, Nitrous oxide, Opium, PCP, Solvent inhalants    Social History   Social History  . Marital status: Single    Spouse name: N/A  . Number of children: N/A  . Years of education: N/A   Occupational History  . student     11th grade at a [employer] facility   Social History Main Topics  . Smoking status: Current Every Day Smoker  Packs/day: 1.00    Years: 5.00    Types: Cigarettes  . Smokeless tobacco: Never Used  . Alcohol use 0.5 oz/week    1 Standard drinks or equivalent per week     Comment: 1 - 1/2 5ths of bourbon  . Drug use: Yes    Frequency: 7.0 times per week    Types: Amphetamines, Benzodiazepines, Cocaine, Codeine, Fentanyl, Hashish, Heroin, Hydrocodone,  Hydromorphone, LSD, Marijuana, MDMA (Ecstacy), Methamphetamines, Morphine, Nitrous oxide, Opium, PCP, Solvent inhalants  . Sexual activity: Yes    Partners: Female    Birth control/ protection: Condom   Other Topics Concern  . None   Social History Narrative  . None    Hospital Course:  Dustin Owens was admitted for Bipolar affective disorder, depressed, severe (HCC)  and crisis management.  Pt was treated discharged with the medications listed below under Medication List.  Medical problems were identified and treated as needed.  Home medications were restarted as appropriate.  Improvement was monitored by observation and [employer] 's daily report of symptom reduction.  Emotional and mental status was monitored by daily self-inventory reports completed by [facility] and clinical staff.         Dustin Owens was evaluated by the treatment team for stability and plans for continued recovery upon discharge. Dustin Owens 's motivation was an integral factor for scheduling further treatment. Employment, transportation, bed availability, health status, family support, and any pending legal issues were also considered during hospital stay.  Pt was offered further treatment options upon discharge including but not limited to Residential, Intensive Outpatient, and Outpatient treatment.  Dustin Owens will follow up with the services as listed below under Follow Up Information.    Upon completion of this admission the patient was both mentally and medically stable for discharge denying suicidal/homicidal ideation, auditory/visual/tactile hallucinations, delusional thoughts and paranoia.    Dustin Owens responded well to treatment with Zyprexa 10mg  and Trazodone 100 mg  without adverse effects.Pt demonstrated improvement without reported or observed adverse effects to the point of stability appropriate for outpatient management. Pertinent labs include: Hepatic function panel, Phosphorus and BMP  for which outpatient follow-up is necessary for lab recheck as mentioned below. Reviewed CBC, CMP, BAL, and UDS; all unremarkable aside from noted exceptions.   Physical Findings: AIMS: Facial and Oral Movements Muscles of Facial Expression: None, normal Lips and Perioral Area: None, normal Jaw: None, normal Tongue: None, normal,Extremity Movements Upper (arms, wrists, hands, fingers): None, normal Lower (legs, knees, ankles, toes): None, normal, Trunk Movements Neck, shoulders, hips: None, normal, Overall Severity Severity of abnormal movements (highest score from questions above): None, normal Incapacitation due to abnormal movements: None, normal Patient's awareness of abnormal movements (rate only patient's report): No Awareness, Dental Status Current problems with teeth and/or dentures?: No Does patient usually wear dentures?: No  CIWA:    COWS:  COWS Total Score: 8  Musculoskeletal: Strength & Muscle Tone: within normal limits Gait & Station: normal Patient leans: N/A  Psychiatric Specialty Exam: See SRA by MD Physical Exam  Vitals reviewed. Constitutional: He is oriented to person, place, and time. He appears well-developed.  Neurological: He is alert and oriented to person, place, and time.  Skin: Skin is warm and dry.  Psychiatric: He has a normal mood and affect. His behavior is normal.    Review of Systems  Psychiatric/Behavioral: Negative for depression (STABLE) and suicidal ideas. The patient is not nervous/anxious.  Blood pressure 105/64, pulse 81, temperature (!) 97.4 F (36.3 C), resp. rate 16, height 6' (1.829 m), weight 62.6 kg (138 lb).Body mass index is 18.72 kg/m.   Have you used any form of tobacco in the last 30 days? (Cigarettes, Smokeless Tobacco, Cigars, and/or Pipes): Yes  Has this patient used any form of tobacco in the last 30 days? (Cigarettes, Smokeless Tobacco, Cigars, and/or Pipes)  Yes, A prescription for an FDA-approved tobacco cessation  medication was offered at discharge and the patient refused and Prescription not provided due to an Allergy to all of the FDA-approved tobacco cessation medications  Blood Alcohol level:  Lab Results  Component Value Date   ETH <10 01/15/2017   ETH 45 (H) 11/26/2016    Metabolic Disorder Labs:  Lab Results  Component Value Date   HGBA1C 4.9 12/02/2016   MPG 93.93 12/02/2016   Lab Results  Component Value Date   PROLACTIN 32.5 (H) 12/02/2016   Lab Results  Component Value Date   CHOL 146 12/02/2016   TRIG 102 12/02/2016   HDL 48 12/02/2016   CHOLHDL 3.0 12/02/2016   VLDL 20 12/02/2016   LDLCALC 78 12/02/2016    See Psychiatric Specialty Exam and Suicide Risk Assessment completed by Attending Physician prior to discharge.  Discharge destination:  Home  Is patient on multiple antipsychotic therapies at discharge:  No   Has Patient had three or more failed trials of antipsychotic monotherapy by history:  No  Recommended Plan for Multiple Antipsychotic Therapies: NA  Discharge Instructions    Diet - low sodium heart healthy    Complete by:  As directed    Discharge instructions    Complete by:  As directed    Take all medications as prescribed. Keep all follow-up appointments as scheduled.  Do not consume alcohol or use illegal drugs while on prescription medications. Report any adverse effects from your medications to your primary care provider promptly.  In the event of recurrent symptoms or worsening symptoms, call 911, a crisis hotline, or go to the nearest emergency department for evaluation.   Increase activity slowly    Complete by:  As directed      Allergies as of 01/26/2017      Reactions   Naproxen Nausea And Vomiting   Percocet [oxycodone-acetaminophen] Other (See Comments)   Acid reflux      Medication List    STOP taking these medications   folic acid 1 MG tablet Commonly known as:  FOLVITE   multivitamin with minerals Tabs tablet    nicotine 14 mg/24hr patch Commonly known as:  NICODERM CQ - dosed in mg/24 hours   thiamine 100 MG tablet     TAKE these medications     Indication  nicotine polacrilex 2 MG gum Commonly known as:  NICORETTE Take 1 each (2 mg total) by mouth as needed for smoking cessation.  Indication:  Nicotine Addiction   OLANZapine 10 MG tablet Commonly known as:  ZYPREXA Take 1 tablet (10 mg total) by mouth at bedtime.  Indication:  Major Depressive Disorder   traZODone 100 MG tablet Commonly known as:  DESYREL Take 1 tablet (100 mg total) by mouth at bedtime and may repeat dose one time if needed.  Indication:  Trouble Sleeping      Follow-up Information    Services, Alcohol And Drug Follow up.   Specialty:  Behavioral Health Why:  Please go as a walk-in within 5 days of discharge to be established for outpatient  services, including substance abuse intensive outpatient program. Walk-in hours are Mon, Wed, Fri 12p-3p. Please arrive as early as possible to be sure that you are seen. Contact information: [address] [place] [address] [phone]           Follow-up recommendations:  Activity:  as tolerated Diet:  heart healthy  [facility]  Comments:  Take all medications as prescribed. Keep all follow-up appointments as scheduled.  Do not consume alcohol or use illegal drugs while on prescription medications. Report any adverse effects from your medications to your primary care provider promptly.  In the event of recurrent symptoms or worsening symptoms, call 911, a crisis hotline, or go to the nearest emergency department for evaluation.   Signed: Oneta Rack, NP 01/26/2017, 11:36 AM   Patient seen, Suicide Assessment Completed.  Disposition Plan Reviewed   -Zaeem Saliba is a 21 y/o M with history of bipolar disorder who presented after overdose on methylphenidate and amlodipine. Pt as medically cleared and transferred to [facility].  He was started on olanzapine to address mood stabilization. Today, he reports that he is doing well. He denies SI/HI/AH/VH. He states that his plan is to [facility] and for follow up. He is tolerating his medications without difficulty. He is sleeping well and appetite is good. He had no other concerns at time of evaluation. Pt was able to engage in safety planning including to return to [facility] or call 911 if he feels unable to maintain his own safety.  Plan Of Care/Follow-up recommendations:  - Continue zyprexa 10mg  qhs - Continue trazodone 100mg  qhs prn insomnia  Activity:  as tolerated Diet:  normal Tests:  NA Other:  see above for DC plan  Micheal Likens, MD

## 2017-01-26 NOTE — Progress Notes (Signed)
  [facility] :  Will you be returning to the same living situation after discharge:  No. Pt will be discharging to [facility]. At discharge, do you have transportation home?: Yes,  Winferd Humphreyee Gray from [facility] will transport. Do you have the ability to pay for your medications: Yes,  prescriptions and samples provided.  Release of information consent forms completed and in the chart;  Patient's signature needed at discharge.  Patient to Follow up at: Follow-up Information    Services, Alcohol And Drug Follow up.   Specialty:  Behavioral Health Why:  Please go as a walk-in within 5 days of discharge to be established for outpatient services, including substance abuse intensive outpatient program. Walk-in hours are Mon, Wed, Fri 12p-3p. Please arrive as early as possible to be sure that you are seen. Contact information: [address] [place] [address] [phone]           Next level of care provider [facility] Link:no  Safety Planning and Suicide Prevention discussed: Yes,  with pt.  Have you used any form of tobacco in the last 30 days? (Cigarettes, Smokeless Tobacco, Cigars, and/or Pipes): Yes  Has patient been referred to the Quitline?: Patient refused referral  Patient has been referred for addiction treatment: Yes  Jonathon JordanLynn B Abe Schools, MSW, LCSWA 01/26/2017, 9:24 AM

## 2017-01-26 NOTE — Progress Notes (Signed)
Pt reports he is just waiting to be discharged and feels he has been in the hospital long enough.  He denies SI/HI/AVH.  He voices no needs or concerns other than his desire to be discharged.  He has been in the dayroom all evening talking and socializing with the other patients.  He has otherwise been appropriate and cooperative.  Pt makes his needs known to staff.  Support and encouragement offered.  Discharge plans are in process.  Safety maintained with q15 minute checks.

## 2017-01-26 NOTE — [facility] Suicide Risk Assessment (Signed)
[facility]   Principal Problem: Bipolar affective disorder, depressed, severe (HCC) Discharge Diagnoses:  Patient Active Problem List   Diagnosis Date Noted  . Polysubstance abuse (HCC) [F19.10] 01/18/2017  . Overdose, drug, intentional self-harm, initial encounter (HCC) [T50.902A] 01/16/2017  . Substance induced mood disorder (HCC) [F19.94] 11/28/2016  . Bipolar 1 disorder (HCC) [F31.9] 11/27/2016  . Bipolar affective disorder, depressed, severe (HCC) [F31.4] 04/14/2016  . Suicide attempt by drug ingestion (HCC) [T50.902A]   . Cocaine abuse (HCC) [F14.10] 04/12/2016  . Severe recurrent major depression with psychotic features (HCC) [F33.3] 07/01/2011  . 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 Polysubstance dependence (HCC) [F19.20] 07/01/2011    Total Time spent with patient: 30 minutes  Musculoskeletal: Strength & Muscle Tone: within normal limits Gait & Station: normal Patient leans: N/A  Psychiatric Specialty Exam: Review of Systems  Constitutional: Negative for chills and fever.  Respiratory: Negative for cough and shortness of breath.   Cardiovascular: Negative for chest pain.  Gastrointestinal: Negative for nausea and vomiting.    Blood pressure 105/64, pulse 81, temperature (!) 97.4 F (36.3 C), resp. rate 16, height 6' (1.829 m), weight 62.6 kg (138 lb).Body mass index is 18.72 kg/m.  General Appearance: Casual  Eye Contact::  Good  Speech:  Clear and Coherent  Volume:  Normal  Mood:  Euthymic  Affect:  Appropriate and Congruent  Thought Process:  Goal Directed  Orientation:  Full (Time, Place, and Person)  Thought Content:  Logical  Suicidal Thoughts:  No  Homicidal Thoughts:  No  Memory:  Immediate;   Good Recent;   Good Remote;   Good  Judgement:  Fair  Insight:  Fair  Psychomotor Activity:  Normal  Concentration:  Good  Recall:  Good  Fund of Knowledge:Good  Language: Good  Akathisia:  No  Handed:     AIMS (if indicated):     Assets:  Desire for Improvement Financial Resources/Insurance Physical Health Social Support  Sleep:  Number of Hours: 5.5  Cognition: WNL  ADL's:  Intact   Mental Status Per Nursing Assessment::   On Admission:     Demographic Factors:  Male, Adolescent or young adult, Caucasian, Low socioeconomic status and Unemployed  Loss Factors: Financial problems/change in socioeconomic status  Historical Factors: Family history of mental illness or substance abuse and Impulsivity  Risk Reduction Factors:   Positive social support, Positive therapeutic relationship and Positive coping skills or problem solving skills  Continued Clinical Symptoms:  Bipolar Disorder:   Depressive phase Alcohol/Substance Abuse/Dependencies More than one psychiatric diagnosis  Cognitive Features That Contribute To Risk:  None    Suicide Risk:  Minimal: No identifiable suicidal ideation.  Patients presenting with no risk factors but with morbid ruminations; may be classified as minimal risk based on the severity of the depressive symptoms  Follow-up Information    Services, Alcohol And Drug Follow up.   Specialty:  Behavioral Health Why:  Please go as a walk-in within 5 days of discharge to be established for outpatient services, including substance abuse intensive outpatient program. Walk-in hours are Mon, Wed, Fri 12p-3p. Please arrive as early as possible to be sure that you are seen. Contact information: [address] [place] [address] [phone]          Subjective data: -Dustin Owens is a 21 y/o M with history of bipolar disorder who presented after overdose on methylphenidate and amlodipine. [facility]  He was started on olanzapine to address mood stabilization. Today, he reports that he is doing well. He denies SI/HI/AH/VH. He states that his plan is to go to [facility] to stay and for follow up. He is  tolerating his medications without difficulty. He is sleeping well and appetite is good. He had no other concerns at time of evaluation. Pt was able to engage in safety planning including to return to [facility] or call 911 if he feels unable to maintain his own safety.    Plan Of Care/Follow-up recommendations:  - Continue zyprexa 10mg  qhs - Continue trazodone 100mg  qhs prn insomnia  Activity:  as tolerated Diet:  normal Tests:  NA Other:  see above for DC plan  Micheal Likens, MD 01/26/2017, 9:46 AM

## 2017-12-23 DIAGNOSIS — R001 Bradycardia, unspecified: Secondary | ICD-10-CM

## 2017-12-23 DIAGNOSIS — R45851 Suicidal ideations: Secondary | ICD-10-CM

## 2017-12-23 DIAGNOSIS — F10929 Alcohol use, unspecified with intoxication, unspecified: Secondary | ICD-10-CM

## 2017-12-23 DIAGNOSIS — T50901A Poisoning by unspecified drugs, medicaments and biological substances, accidental (unintentional), initial encounter: Secondary | ICD-10-CM

## 2017-12-24 DIAGNOSIS — R001 Bradycardia, unspecified: Secondary | ICD-10-CM

## 2019-03-19 IMAGING — DX DG CHEST 1V PORT
1 series · 2 of 2 positions shown · non-contrast
Comparison: None.

CLINICAL DATA: Overdose.  Smoker.

EXAM:
PORTABLE CHEST 1 VIEW

[Series 1: chest ap · 0.14mm/px · 2 of 2 slices shown]
[im 1/2]
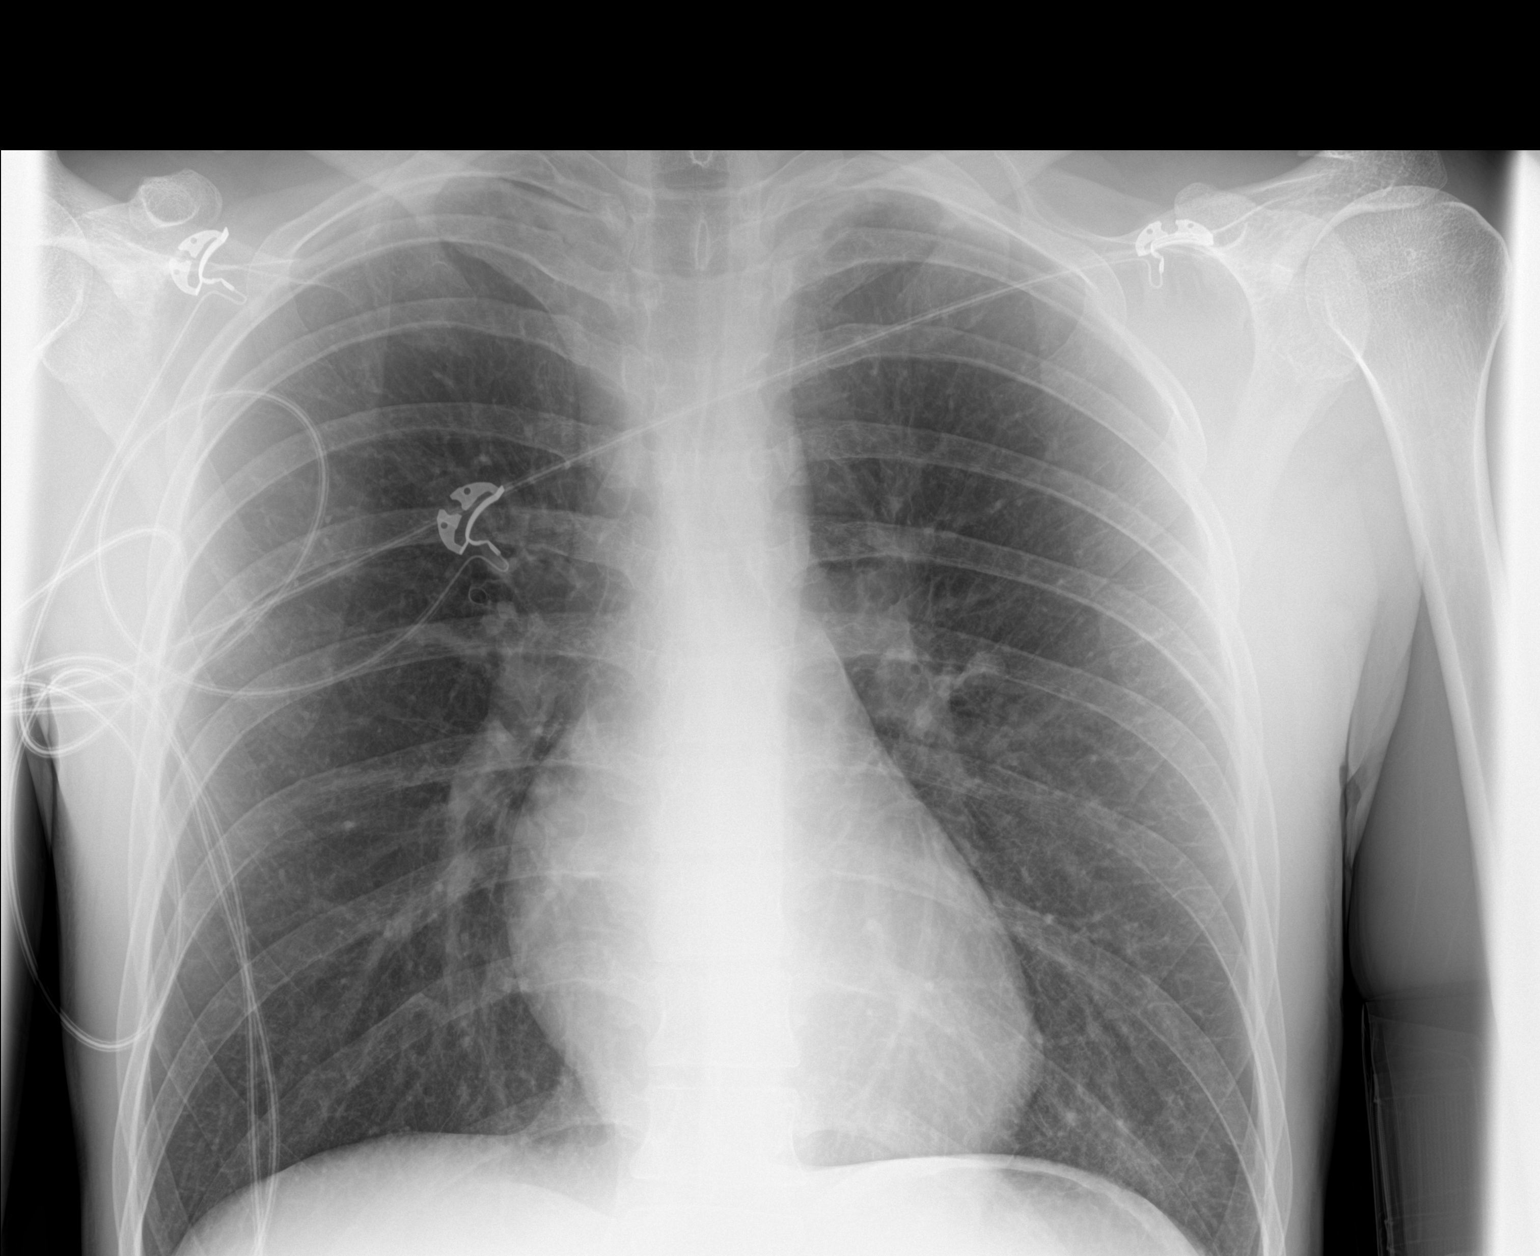
[im 2/2]
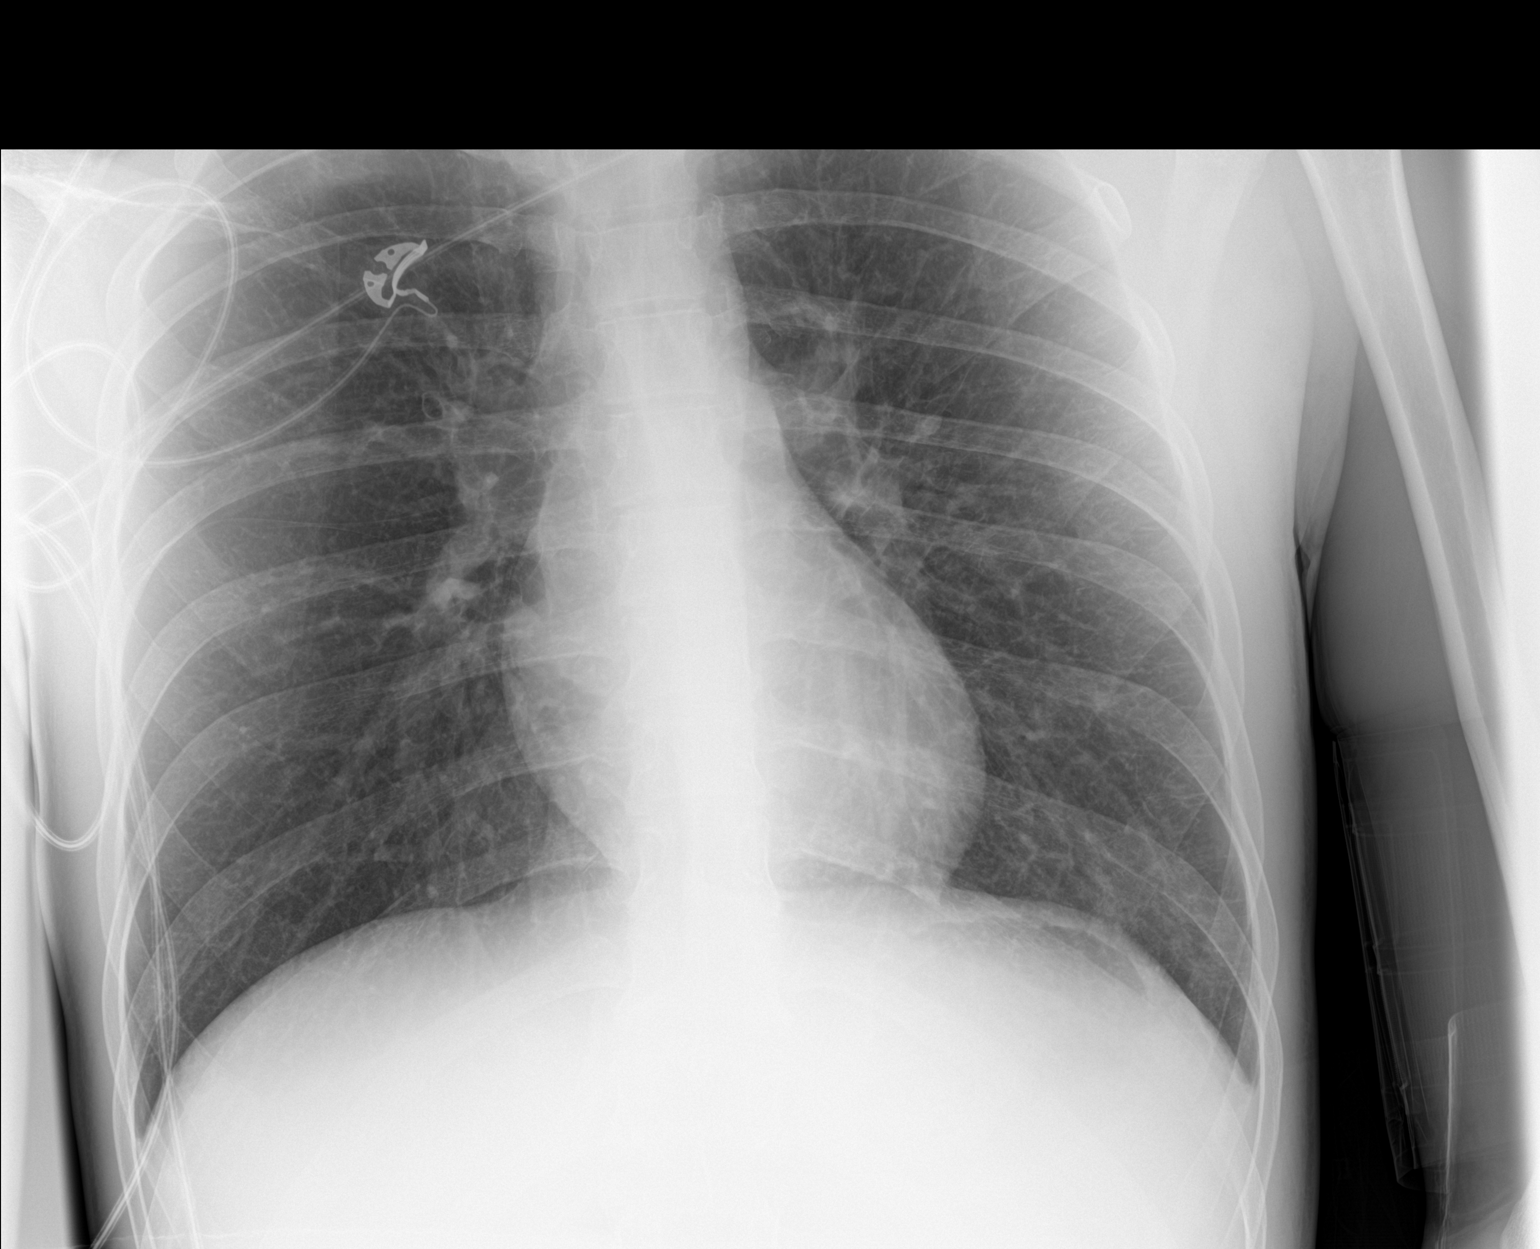

[2 of 2 positions shown; findings below may reference images not displayed]

FINDINGS: The heart size and mediastinal contours are within normal limits.
Both lungs are clear. The visualized skeletal structures are
unremarkable.
IMPRESSION: No active disease.
